# Patient Record
Sex: Female | Born: 1960 | Race: White | Hispanic: No | Smoking: Never smoker
Health system: Southern US, Community
[De-identification: ages and names within clinical notes are randomized; demographics above are authoritative.]

## PROBLEM LIST (undated history)

## (undated) DIAGNOSIS — I2510 Atherosclerotic heart disease of native coronary artery without angina pectoris: Secondary | ICD-10-CM

## (undated) DIAGNOSIS — E785 Hyperlipidemia, unspecified: Secondary | ICD-10-CM

## (undated) DIAGNOSIS — Z8249 Family history of ischemic heart disease and other diseases of the circulatory system: Secondary | ICD-10-CM

## (undated) DIAGNOSIS — I10 Essential (primary) hypertension: Secondary | ICD-10-CM

## (undated) DIAGNOSIS — E079 Disorder of thyroid, unspecified: Secondary | ICD-10-CM

## (undated) HISTORY — DX: Essential (primary) hypertension: I10

## (undated) HISTORY — PX: THYROIDECTOMY: SHX17

## (undated) HISTORY — PX: ABDOMINAL HYSTERECTOMY: SHX81

## (undated) HISTORY — DX: Hyperlipidemia, unspecified: E78.5

## (undated) HISTORY — PX: APPENDECTOMY: SHX54

## (undated) HISTORY — DX: Family history of ischemic heart disease and other diseases of the circulatory system: Z82.49

## (undated) HISTORY — PX: CHOLECYSTECTOMY: SHX55

## (undated) HISTORY — PX: CARDIAC CATHETERIZATION: SHX172

## (undated) HISTORY — DX: Atherosclerotic heart disease of native coronary artery without angina pectoris: I25.10

---

## 1997-12-17 ENCOUNTER — Encounter: Payer: Self-pay | Admitting: Gastroenterology

## 1997-12-17 ENCOUNTER — Ambulatory Visit (HOSPITAL_COMMUNITY): Admission: RE | Admit: 1997-12-17 | Discharge: 1997-12-17 | Payer: Self-pay | Admitting: Gastroenterology

## 1998-10-09 ENCOUNTER — Other Ambulatory Visit: Admission: RE | Admit: 1998-10-09 | Discharge: 1998-10-09 | Payer: Self-pay | Admitting: Obstetrics and Gynecology

## 1999-04-10 ENCOUNTER — Ambulatory Visit (HOSPITAL_COMMUNITY): Admission: RE | Admit: 1999-04-10 | Discharge: 1999-04-10 | Payer: Self-pay | Admitting: Gastroenterology

## 1999-04-10 ENCOUNTER — Encounter: Payer: Self-pay | Admitting: Gastroenterology

## 1999-12-10 ENCOUNTER — Other Ambulatory Visit: Admission: RE | Admit: 1999-12-10 | Discharge: 1999-12-10 | Payer: Self-pay | Admitting: Obstetrics and Gynecology

## 2000-04-12 ENCOUNTER — Encounter: Payer: Self-pay | Admitting: General Surgery

## 2000-04-12 ENCOUNTER — Encounter (INDEPENDENT_AMBULATORY_CARE_PROVIDER_SITE_OTHER): Payer: Self-pay

## 2000-04-12 ENCOUNTER — Observation Stay (HOSPITAL_COMMUNITY): Admission: RE | Admit: 2000-04-12 | Discharge: 2000-04-13 | Payer: Self-pay | Admitting: General Surgery

## 2000-12-05 ENCOUNTER — Encounter: Payer: Self-pay | Admitting: Gastroenterology

## 2000-12-05 ENCOUNTER — Ambulatory Visit (HOSPITAL_COMMUNITY): Admission: RE | Admit: 2000-12-05 | Discharge: 2000-12-05 | Payer: Self-pay | Admitting: Gastroenterology

## 2001-06-26 ENCOUNTER — Other Ambulatory Visit: Admission: RE | Admit: 2001-06-26 | Discharge: 2001-06-26 | Payer: Self-pay | Admitting: Obstetrics and Gynecology

## 2002-12-28 ENCOUNTER — Other Ambulatory Visit: Admission: RE | Admit: 2002-12-28 | Discharge: 2002-12-28 | Payer: Self-pay | Admitting: Obstetrics and Gynecology

## 2004-02-18 ENCOUNTER — Other Ambulatory Visit: Admission: RE | Admit: 2004-02-18 | Discharge: 2004-02-18 | Payer: Self-pay | Admitting: Obstetrics and Gynecology

## 2005-03-09 ENCOUNTER — Other Ambulatory Visit: Admission: RE | Admit: 2005-03-09 | Discharge: 2005-03-09 | Payer: Self-pay | Admitting: Obstetrics and Gynecology

## 2005-08-08 ENCOUNTER — Encounter: Payer: Self-pay | Admitting: Family Medicine

## 2005-08-08 LAB — CONVERTED CEMR LAB: Pap Smear: NORMAL

## 2005-10-04 ENCOUNTER — Ambulatory Visit: Payer: Self-pay | Admitting: Family Medicine

## 2005-11-01 ENCOUNTER — Ambulatory Visit: Payer: Self-pay | Admitting: Family Medicine

## 2006-01-14 ENCOUNTER — Ambulatory Visit: Payer: Self-pay | Admitting: Family Medicine

## 2006-05-25 ENCOUNTER — Encounter: Payer: Self-pay | Admitting: Family Medicine

## 2006-05-25 DIAGNOSIS — K219 Gastro-esophageal reflux disease without esophagitis: Secondary | ICD-10-CM

## 2006-05-25 DIAGNOSIS — E039 Hypothyroidism, unspecified: Secondary | ICD-10-CM | POA: Insufficient documentation

## 2006-07-28 ENCOUNTER — Ambulatory Visit: Payer: Self-pay | Admitting: Family Medicine

## 2006-09-20 ENCOUNTER — Observation Stay (HOSPITAL_COMMUNITY): Admission: EM | Admit: 2006-09-20 | Discharge: 2006-09-20 | Payer: Self-pay | Admitting: Emergency Medicine

## 2006-09-22 ENCOUNTER — Encounter: Payer: Self-pay | Admitting: Family Medicine

## 2009-09-01 ENCOUNTER — Ambulatory Visit (HOSPITAL_BASED_OUTPATIENT_CLINIC_OR_DEPARTMENT_OTHER): Admission: RE | Admit: 2009-09-01 | Discharge: 2009-09-01 | Payer: Self-pay | Admitting: Orthopedic Surgery

## 2010-04-25 LAB — POCT HEMOGLOBIN-HEMACUE: Hemoglobin: 13.5 g/dL (ref 12.0–15.0)

## 2010-06-23 NOTE — H&P (Signed)
[name], [name]               ACCOUNT NO.:  [id number]   MEDICAL RECORD NO.:  [id number]          PATIENT TYPE:  INP   LOCATION:  3742                         FACILITY:  [facility]   PHYSICIAN:  Abelino Derrick, P.A.   DATE OF BIRTH:  01/02/1961   DATE OF ADMISSION:  09/20/2006  DATE OF DISCHARGE:                              HISTORY & PHYSICAL   CHIEF COMPLAINT:  Chest pain.   HISTORY OF PRESENT ILLNESS:  Patient is a 50 year old female with a  history of mitral valve prolapse and palpitations.  She was evaluated 10  years ago by Dr. Clarene Duke.  Echocardiogram was done at that time.  She has  no history of a cath or exercise study.  She is admitted at the  emergency room today with substernal chest pain, described as pressure.  Onset was yesterday off and on, but recurred at rest today at work, with  increasing intensity and some shortness of breath.  She had no  associated nausea or vomiting, diaphoresis or associated palpitations,  or anxiety.  She is now pain-free in the emergency room.   PAST MEDICAL HISTORY:  Remarkable for treated hypothyroidism.  She has  had a cholecystectomy, hysterectomy, tonsillectomy and single  oophorectomy.   CURRENT MEDICATIONS:  1. Synthroid 0.137 mg a day.  2. Pepcid 20-40 mg a day.   ALLERGIES:  She is allergic to SULFA AND MYOSINS.  In the past, she  could not tolerate beta blockers because of fatigue.   SOCIAL HISTORY:  She is divorced.  She has two children in their 36s.  She has never smoked.  She works for an [employer].   FAMILY HISTORY:  Father had a history of coronary disease.  He had PCIs  in his 21s.  He also had an abdominal aortic aneurysm repair and  grafting, and bypass surgery.  He is now a patient of Dr. Darrol Poke.  He  is 10 years old.  Her mother has a history of a pacemaker.   REVIEW OF SYMPTOMS:  Essentially unremarkable except as noted above.  She denies any GI bleeding or melena.  She does not have diabetes.  Her  lipid  status is unknown.  She occasionally has some tingling in her  fingers, but this is not associated with periorbital numbness, anxiety  or shortness of breath.  She denies any significant palpitations  recently.   PHYSICAL EXAMINATION:  VITAL SIGNS:  Blood pressure 115/76, pulse 82,  respirations 12.  GENERAL:  She is a well-developed, well-nourished female in no acute  distress.  She appears calm in the emergency room.  HEENT:  Normocephalic.  Extraocular muscles intact.  Sclera are  nonicteric.  Lens and conjunctiva are within normal limits.  NECK:  Without bruit or JVD.  CHEST:  Clear to auscultation and percussion.  CARDIAC:  Reveals a regular rate and rhythm without murmur, rub or  gallop.  Normal S1 and S2.  ABDOMEN:  Nontender.  No hepatosplenomegaly.  Bowel sounds are present.  EXTREMITIES:  Without edema.  Distal pulses are 3+/4 bilaterally.  NEURO:  Grossly intact.  She is  awake, alert, oriented and cooperative.  She moves all extremities without obvious deficits.  SKIN:  Warm and dry.   LABORATORY DATA:  Sodium 138, potassium 4.0, BUN 12, creatinine 1.0,  white count 6.7, hemoglobin 13.3, hematocrit 38.3, platelets 254.  INR  1.0, troponin negative.  EKG showed sinus rhythm without acute changes.  Chest x-ray shows no active disease.   IMPRESSION:  1. Chest pain, worrisome for unstable angina.  2. Family history of coronary disease.  3. Treated with hypothyroidism.   PLAN:  Patient will be admitted for 23-hour observation, rule out  myocardial infarction.  She may need diagnostic catheterization versus  exercise Myoview.      Abelino Derrick, P.ALenard Lance  D:  09/20/2006  T:  09/21/2006  Job:  [id number]   cc:   Thereasa Solo. Little, M.D.

## 2010-06-23 NOTE — Cardiovascular Report (Signed)
[name], [name]               ACCOUNT NO.:  [id number]   MEDICAL RECORD NO.:  [id number]          PATIENT TYPE:  INP   LOCATION:  3742                         FACILITY:  [facility]   PHYSICIAN:  Ritta Slot, MD     DATE OF BIRTH:  08/10/1960   DATE OF PROCEDURE:  09/20/2006  DATE OF DISCHARGE:  09/20/2006                            CARDIAC CATHETERIZATION   CARDIAC CATHETERIZATION:   PROCEDURES PERFORMED:  1. Selective coronary angiography of the native coronary circulation      by the Judkins technique.  2. Retrograde left heart catheterization.  3. Left ventricular angiography.   COMPLICATIONS:  None.   ENTRY SITE:  Right femoral artery with dye use of Omnipaque.   PATIENT PROFILE:  Patient is a 50 year old Caucasian female who comes in  today complaining of central chest discomfort for the past 24 hours.  There was no radiation associated with nausea, vomiting, diaphoresis.  In the ER, her ECG was negative as well as her troponin.  However, she  does have a significantly strong family history of premature coronary  artery disease with a father having an MI at the age of 59 and  subsequently having a bypass graft at the age of 74 and a mother having  a bypass graft at the age of 13.  Risk factors of coronary artery  disease really are family history, otherwise unremarkable.  She was  brought to the cardiac catheterization for further delineation of  coronary artery disease here at [facility].   RESULTS:  Pressures:  LV pressure was 104-8-8.  Aortic pressure was 104-  64-83.  There was no valve gradient by pullback technique.   ANGIOGRAPHIC RESULTS:  1. The left main coronary artery appears normal.  2. The left anterior descending coronary artery appears normal.  3. The left circumflex system appears normal.  4. The right coronary artery system appears normal; the right coronary      artery is dominant.   FINAL DIAGNOSES:  1. Normal coronary arteries.  2. Normal left  ventriculogram with an ejection fraction of 60%.   PLAN:  1. The patient should be managed with medical therapy.  2. Most likely the chest pain is noncardiac.  3. She needs to follow up with her primary care physician for further      management and investigation.  4. Dr. Lavonne Chick was present and supervised the entire procedure.      Ritta Slot, MD  Electronically Signed     HS/MEDQ  D:  09/20/2006  T:  09/20/2006  Job:  [id number]   cc:   Thereasa Solo. Little, M.D.

## 2010-06-26 NOTE — Assessment & Plan Note (Signed)
[facility]                             [place] OFFICE NOTE   [name], [name]                      MRN:          [id number]  DATE:10/04/2005                            DOB:          1960-12-06    CHIEF COMPLAINT:  A 50 year old white female here to reestablish.  She was  previously seen in 2002.   HISTORY OF PRESENT ILLNESS:  [name] comes to clinic today with two  concerns.  1. Sore throat, acute:  She states that she has been having problems with      sore throat for about one week.  She does have some congestion and      postnasal drip.  She does have a sick contact of her ex-boyfriend.  She      occasionally has a dry cough.  She is somewhat fatigued.  She also is      concerned because she feels that she has some gritty feeling in her      mouth and has some difficulty swallowing because it feels like the back      of her throat is coated.  She has had an oral candidiasis in the past      as well as vaginal candidiasis that she was thought by a previous      doctor to have received from her boyfriend.  She is concerned at this      point that she may have a candidal infection of her throat.  She also      has noticed that she had some irritation and bleeding of her tongue.  2. Stress:  She reports that she has a history of depression.  She is      going through an increased period of stress.  She divorced her husband      two years ago, and it was her choice to leave her husband, but the 25th      anniversary is today.  She states that she is also having an increased      amount of financial stress secondary to being on her own.  She has also      recently broken up several days ago with her last boyfriend.  She is      having some problems with insomnia but not consistently.  She has      occasional episodes of crying and feelings of excessive guilt.  She      denies suicidal or homicidal ideation.  In the past, she has been on      Lexapro, Effexor, and Wellbutrin.  She had significant side effects to      Wellbutrin and did not stay on the other medications long enough to see      if they worked.  She has some leftover Xanax from when she saw a      psychiatrist several years ago.  She took some this morning, a very      small fragment of the pill.  She states that it helped some.   PAST MEDICAL HISTORY:  1. Hypothyroidism, status post  thyroiditis.  2. Gastroesophageal reflux disease.   HOSPITALIZATIONS/SURGERIES/PROCEDURES:  1. In 1990, appendectomy.  2. In 1988, hysterectomy, partial, for menorrhagia.  3. In 2002, cholecystectomy.  4. Tonsillectomy.  5. G4 P2, status post 2 miscarriages.  6. Abdominal ultrasound.  Left ovarian cyst.  7. Mammogram in 2006, normal.  8. Pap smear in July 2007, normal.  9. Colonoscopy in 1990s, normal.   ALLERGIES:  1. CODEINE.  2. SULFA.  3. ERYTHROMYCIN.   MEDICATIONS:  1. Pepcid 20 mg p.o. b.i.d.  2. Synthroid 0.137 mg daily, with last thyroid hormone checked at her      gynecologist.   FAMILY HISTORY:  Unchanged from previously, except for CVA and coronary  artery bypass graft in her father.   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  She is divorced and has 2 children, ages 73 and 48, who are  healthy.  She walks some, but does not get regular aerobic exercise.  She  eats a variety of foods, including fruits and vegetables.   PHYSICAL EXAMINATION:  VITAL SIGNS:  Height 5 feet 3 inches, weight 131,  blood pressure 119/94, pulse 75, temperature 98.3.  GENERAL:  Healthy-appearing female in no apparent distress.  HEENT:  PERRLA.  Extraocular muscles intact.  Nares clear.  Oropharynx  clear.  Gum line:  White striations that wipe away, slight irritation of  tongue noted.  NECK:  No thyromegaly.  No lymphadenopathy, supraclavicular or cervical.  PULMONARY:  Clear to auscultation bilaterally.  No wheezes, rales, or   rhonchi.  CARDIOVASCULAR:  Regular rate and rhythm.  No murmurs, rubs, or gallops.  ABDOMEN:  Soft, nontender.  Normoactive bowel sounds.  No  hepatosplenomegaly.  MUSCULOSKELETAL:  Strength 5/5 in upper and lower extremities.  NEUROLOGIC:  Alert, oriented x3.  Cranial nerves II-XII grossly intact.  Reflexes 2+.  Sensation to touch within normal limits in upper and lower  extremities.  PSYCHIATRIC:  no suicidal or homicidal ideation.  Appropriate affect.  No  psychomotor retardation.   ASSESSMENT AND PLAN:  1. Sore throat.  This may be secondary to a viral infection.  I did do a      swab of her mouth to determine whether there is yeast, but did not see      evidence of any.  Given the severity of her sore throat symptoms and      the fact that it has continued on for a week, I will have her see if it      resolves in the next few days.  She can treat herself symptomatically      with Magic Mouthwash, which she was given a prescription for.  If it      does not improve, she was given a prescription for Diflucan for      possible oropharyngeal candidiasis.  If this seems to be the cause,      then she likely needs lab workup to evaluate for diabetes as well as an      HIV test, if this has not been done recently.  She does state that she      had recent labs done at Dr. Marylen Ponto.  I will obtain these.  2. Depression.  She meets some of the qualifications for depressive      disorder as well as difficulty to adjust to stress.  We discussed      options as far as initiating an  antidepressant, temporary Xanax, and      referral to a therapist.  At this point in time, she would like to      avoid      medication but is not willing to see a therapist.  She will follow up      in 3-4 weeks to discuss how she is doing with her mood.                                   Kerby Nora, MD   AB/MedQ  DD:  10/05/2005  DT:  10/05/2005 Job #:  [id number]

## 2010-06-26 NOTE — Op Note (Signed)
[facility]  Patient:    [name], [name]                      MRN: [id number] Proc. Date: 04/12/00 Adm. Date:  [id number] Attending:  Tempie Donning CC:         Petra Kuba, M.D.   Operative Report  PREOPERATIVE DIAGNOSIS:  Abnormal gallbladder.  POSTOPERATIVE DIAGNOSIS: Abnormal gallbladder, pending pathology.  OPERATION:  Laparoscopic cholecystectomy with intraoperative cholangiogram.  SURGEON:  Gita Kudo, M.D.  ASSISTANT:  Milus Mallick, M.D.  ANESTHESIA:  General endotracheal.  CLINICAL SUMMARY:  A 50 year old female with chronic abdominal pain admitted for elective cholecystectomy.  She has had aggressive workup that has been very complete over the past several years.  Her gallbladder does not have stones.  The biliary scan was also normal, and her liver function studies are normal.  She has had persistent pain that is consistent with cholecystitis, and, after the exhaustive workup including the above plus endoscopy and other tests, she comes in for cholecystectomy based on clinical findings and especially a history of fatty food intolerance.  Her family history is positive with several family members requiring cholecystectomy.  Also, the patient had an attack of what sounds like biliary colic during the CCK study.  OPERATIVE FINDINGS:  The gallbladder was thin walled, and the cystic duct was quite small.  It and the cystic artery are normal in anatomy and location. She had a few adhesions from her previous pelvic surgery.  DESCRIPTION OF PROCEDURE:  Under satisfactory general endotracheal anesthesia with intravenous antibiotic coverage for mitral valve prolapse, the patients abdomen was prepped and draped in a standard fashion.  The previous infraumbilical incision was opened and extended slightly and then the midline opened into the peritoneum.  Controlled with a figure-of-eight 0 Vicryl suture, an operating Hasson  port inserted.  This was secured, and a good CO2 pneumoperitoneum was established.  Camera placed and, under direct vision, three sites infiltrated with Marcaine.  Two #5 ports were placed laterally and a second #10 port medially.  Graspers in the lateral ports gave excellent exposure, and I operated through the medial port.  The right-angle clamp was used to circumferentially dissect both the cystic duct and cystic artery at the gallbladder/cystic duct junction.   When we were certain of the anatomy, the cystic duct was clipped close to the gallbladder and a small incision made.  A cholangiogram catheter passed through an Angiocath and good x-rays obtained.  They showed no abnormality.  Then the catheter was removed and the duct triply clipped distally and divided.  Likewise, the cystic artery was divided between multiple metal clips and then the gallbladder removed from below upward  using the coagulating spatula for hemostasis and dissection.  As the dissection neared the end, small hole was made in the gallbladder, and clear bile came out.  This was suctioned away.  The abdomen was lavaged with saline and then suctioned dry.  The gallbladder was then removed after a clip was applied at the fundus, and it was transected from the liver bed.  An Endocatch bag was placed in the upper port and the gallbladder put in it.  The camera was then moved to the upper port, and through the lower port, a large grasper was used to remove the gallbladder intact and without spillage.  Then the camera was reinserted through the umbilical port and pictures taken of the lobe of the liver to  document its appearance which was basically normal.  Following this, the abdomen was checked for hemostasis, lavaged with saline, suctioned dry, and then CO2 and ports released.  The midline was closed with the previous suture and the second interrupted 0 Vicryl suture.  Subcutaneous tissue was approximated with 4-0  Vicryl and skin edges with Steri-Strips.  Sterile dressings were applied, and the patient went to the recovery room from the operating room in good condition without complications. DD:  04/12/00 TD:  04/12/00 Job: [id number]

## 2010-11-23 LAB — I-STAT 8, (EC8 V) (CONVERTED LAB)
Acid-Base Excess: 3 — ABNORMAL HIGH
Operator id: 234501
Potassium: 4
TCO2: 30
pCO2, Ven: 47.9
pH, Ven: 7.385 — ABNORMAL HIGH

## 2010-11-23 LAB — COMPREHENSIVE METABOLIC PANEL
AST: 18
Albumin: 4
Calcium: 9.1
Creatinine, Ser: 0.79
GFR calc Af Amer: >60

## 2010-11-23 LAB — POCT I-STAT CREATININE
Creatinine, Ser: 1
Operator id: 234501

## 2010-11-23 LAB — CBC
HCT: 38.3
Hemoglobin: 13.3
Platelets: 254
RBC: 3.89
WBC: 6.7

## 2010-11-23 LAB — DIFFERENTIAL
Eosinophils Relative: 1
Lymphocytes Relative: 17
Lymphs Abs: 1.2
Monocytes Relative: 9

## 2010-11-23 LAB — LIPID PANEL
Cholesterol: 204 — ABNORMAL HIGH
HDL: 65
LDL Cholesterol: 122 — ABNORMAL HIGH
Total CHOL/HDL Ratio: 3.1

## 2010-11-23 LAB — CK TOTAL AND CKMB (NOT AT ARMC)
CK, MB: 0.7
Relative Index: INVALID

## 2010-11-23 LAB — PROTIME-INR: Prothrombin Time: 13.1

## 2010-11-23 LAB — POCT CARDIAC MARKERS: Troponin i, poc: <0.05

## 2011-09-29 ENCOUNTER — Encounter (HOSPITAL_COMMUNITY): Payer: Self-pay

## 2011-09-29 ENCOUNTER — Emergency Department (HOSPITAL_COMMUNITY)
Admission: EM | Admit: 2011-09-29 | Discharge: 2011-09-29 | Disposition: A | Discharge status: Home / Self Care | Payer: BC Managed Care – PPO | Source: Home / Self Care | Attending: Emergency Medicine | Admitting: Emergency Medicine

## 2011-09-29 DIAGNOSIS — S80869A Insect bite (nonvenomous), unspecified lower leg, initial encounter: Secondary | ICD-10-CM

## 2011-09-29 DIAGNOSIS — S90569A Insect bite (nonvenomous), unspecified ankle, initial encounter: Secondary | ICD-10-CM

## 2011-09-29 DIAGNOSIS — T148XXA Other injury of unspecified body region, initial encounter: Secondary | ICD-10-CM

## 2011-09-29 HISTORY — DX: Disorder of thyroid, unspecified: E07.9

## 2011-09-29 MED ORDER — DOXYCYCLINE HYCLATE 100 MG PO CAPS: 100.0000 mg | ORAL_CAPSULE | Freq: Two times a day (BID) | ORAL | Status: AC

## 2011-09-29 MED ORDER — TRIAMCINOLONE ACETONIDE 0.1 % EX CREA: TOPICAL_CREAM | Freq: Two times a day (BID) | CUTANEOUS | Status: DC

## 2011-09-29 NOTE — ED Provider Notes (Addendum)
History     CSN: [id number]  Arrival date & time 09/29/11  1145   First MD Initiated Contact with Patient 09/29/11 1216      Chief Complaint  Patient presents with  . Tick Removal    (Consider location/radiation/quality/duration/timing/severity/associated sxs/prior treatment) HPI Comments: Patient presents urgent care this morning complaining that she removed a tick from the posterior aspect of her knee yesterday and there is a whole area of redness around the area where the tick was. 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 Patient describes that she removed the tick and it was "a very tiny one that was not engorged with blood". She fears that she might have left part of it inside and  under her skin. Describes discrete itchiness to the area. She is mainly concerned that she could have been exposed to Lyme's disease. (Patient has not traveled outside of the state and have not been camping recently in any other state. Patient at this point denies any headaches, fevers, myalgias, arthralgias, nausea or abdominal pain.  The history is provided by the patient.    Past Medical History  Diagnosis Date  . Thyroid disease     Past Surgical History  Procedure Date  . Abdominal hysterectomy   . Cholecystectomy   . Appendectomy   . Thyroidectomy     History reviewed. No pertinent family history.  History  Substance Use Topics  . Smoking status: Never Smoker   . Smokeless tobacco: Not on file  . Alcohol Use: Yes    OB History    Grav Para Term Preterm Abortions TAB SAB Ect Mult Living                  Review of Systems  Constitutional: Negative for fever, chills, activity change, appetite change and fatigue.  Musculoskeletal: Negative for arthralgias.  Skin: Positive for color change and rash. Negative for wound.  Neurological: Negative for dizziness and headaches.    Allergies  Review of patient's allergies indicates not on  file.  Home Medications   Current Outpatient Rx  Name Route Sig Dispense Refill  . FAMOTIDINE 20 MG PO TABS Oral Take 20 mg by mouth 2 (two) times daily.    . THYROID 120 MG PO TABS Oral Take 120 mg by mouth daily.    Marland Kitchen DOXYCYCLINE HYCLATE 100 MG PO CAPS Oral Take 1 capsule (100 mg total) by mouth 2 (two) times daily. 20 capsule 0  . TRIAMCINOLONE ACETONIDE 0.1 % EX CREA Topical Apply topically 2 (two) times daily. Apply bid x 7 days 30 g 0    BP 129/87  Pulse 68  Temp 98 F (36.7 C) (Oral)  Resp 19  SpO2 97%  Physical Exam  Nursing note and vitals reviewed. Constitutional: She appears well-developed and well-nourished. No distress.  Neurological: She is alert.  Skin: No bruising and no rash noted. Rash is not nodular and not vesicular. There is erythema.       ED Course  Procedures (including critical care time)  Labs Reviewed - No data to display No results found.   1. Tick bite of lower leg       MDM   Fibrotic area with minimal erythema, no warmth or tenderness. In the posterior popliteal fossa of right leg. Patient has multiple concerns it range from having been exposed to lyme's disease, plus the potential of a residual piece of the tick mouth and dated in her skin. We  discuss in detail symptoms and signs of a Rickettsia related infection. She denies any other symptoms that we discussed a we discuss in detail the incubation period. We'll still discuss the not so imperative need to remove any residual material that could have been left in her subcutaneous tissue from the tick mouth apparatus. She somewhat agreed with not poking or exploring it. Have provided her with a doxycycline prescription and transabdominal brain she's describing some itchiness in minimal discomfort. She will not start using doxycycline and will be monitoring the symptoms specifically asked to start taking the antibiotic if necessary. Patient has some type of localize hypersensitivity reaction and  minimal erythema around the area. Patient describes that when she removed the tick it had no blood in it and it was very small       Jimmie Molly, MD 09/29/11 2154  Jimmie Molly, MD 09/29/11 2155

## 2011-09-29 NOTE — ED Notes (Signed)
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

## 2011-12-01 ENCOUNTER — Other Ambulatory Visit: Payer: Self-pay | Admitting: Cardiovascular Disease

## 2011-12-03 ENCOUNTER — Ambulatory Visit (HOSPITAL_COMMUNITY)
Admission: RE | Admit: 2011-12-03 | Discharge: 2011-12-03 | Disposition: A | Discharge status: Home / Self Care | Payer: BC Managed Care – PPO | Source: Ambulatory Visit | Attending: Cardiovascular Disease | Admitting: Cardiovascular Disease

## 2011-12-03 ENCOUNTER — Encounter (HOSPITAL_COMMUNITY)
Admission: RE | Disposition: A | Discharge status: Home / Self Care | Payer: Self-pay | Source: Ambulatory Visit | Attending: Cardiovascular Disease

## 2011-12-03 DIAGNOSIS — R9439 Abnormal result of other cardiovascular function study: Secondary | ICD-10-CM | POA: Insufficient documentation

## 2011-12-03 DIAGNOSIS — R55 Syncope and collapse: Secondary | ICD-10-CM | POA: Insufficient documentation

## 2011-12-03 HISTORY — PX: LEFT HEART CATHETERIZATION WITH CORONARY ANGIOGRAM: SHX5451

## 2011-12-03 LAB — PROTIME-INR
INR: 0.88 (ref 0.00–1.49)
Prothrombin Time: 11.9 seconds (ref 11.6–15.2)

## 2011-12-03 SURGERY — LEFT HEART CATHETERIZATION WITH CORONARY ANGIOGRAM
Anesthesia: LOCAL

## 2011-12-03 MED ORDER — SODIUM CHLORIDE 0.9 % IV SOLN
INTRAVENOUS | Status: DC
  Administered 2011-12-03: 09:00:00 via INTRAVENOUS

## 2011-12-03 MED ORDER — NITROGLYCERIN 0.2 MG/ML ON CALL CATH LAB
INTRAVENOUS | Status: AC
  Filled 2011-12-03: qty 1

## 2011-12-03 MED ORDER — ONDANSETRON HCL 4 MG/2ML IJ SOLN: 4.0000 mg | Freq: Four times a day (QID) | INTRAMUSCULAR | Status: DC | PRN

## 2011-12-03 MED ORDER — LIDOCAINE HCL (PF) 1 % IJ SOLN
INTRAMUSCULAR | Status: AC
  Filled 2011-12-03: qty 30

## 2011-12-03 MED ORDER — OXYCODONE-ACETAMINOPHEN 5-325 MG PO TABS: 1.0000 | ORAL_TABLET | ORAL | Status: DC | PRN

## 2011-12-03 MED ORDER — FENTANYL CITRATE 0.05 MG/ML IJ SOLN
INTRAMUSCULAR | Status: AC
  Filled 2011-12-03: qty 2

## 2011-12-03 MED ORDER — MIDAZOLAM HCL 2 MG/2ML IJ SOLN
INTRAMUSCULAR | Status: AC
  Filled 2011-12-03: qty 2

## 2011-12-03 MED ORDER — SODIUM CHLORIDE 0.9 % IJ SOLN: 3.0000 mL | Freq: Two times a day (BID) | INTRAMUSCULAR | Status: DC

## 2011-12-03 MED ORDER — SODIUM CHLORIDE 0.9 % IV SOLN: 1.0000 mL/kg/h | INTRAVENOUS | Status: DC

## 2011-12-03 MED ORDER — HEPARIN SODIUM (PORCINE) 1000 UNIT/ML IJ SOLN
INTRAMUSCULAR | Status: AC
  Filled 2011-12-03: qty 1

## 2011-12-03 MED ORDER — ATROPINE SULFATE 1 MG/ML IJ SOLN
INTRAMUSCULAR | Status: AC
  Filled 2011-12-03: qty 1

## 2011-12-03 MED ORDER — ACETAMINOPHEN 325 MG PO TABS: 650.0000 mg | ORAL_TABLET | ORAL | Status: DC | PRN

## 2011-12-03 NOTE — Progress Notes (Signed)
I was called to patients room because of decreased right-sided peripheral vision in the right eye.  When I arrived the pt stated her vision was back too normal.  Cranial nerves appear intact.  Left arm strength 4/5.  I did not check the right do to just having a radial cath.  BP109/68.  Heart:  RRR, no MM, R, G.    Will ambulate in the hall prior to DC.  I instructed the patient to go to the ER should her vision change.  [name] 1:36 PM.

## 2011-12-03 NOTE — CV Procedure (Signed)
CARDIAC CATHETERIZATION REPORT   Procedures performed:  1. Left heart catheterization  2. Selective coronary angiography  3. Left ventriculography   Reason for procedure:  Abnormal stress test   Procedure performed by: Thurmon Fair, MD, [facility]  Complications: none   Estimated blood loss: less than 5 mL   History:  This is a 51 year old woman with hyperlipidemia and a family history of premature cardiac illness with recurrent problems with chest discomfort shortness of breath and an abnormal cardiopulmonary stress test suggestive of ischemic response.  Consent: The risks, benefits, and details of the procedure were explained to the patient. Risks including death, MI, stroke, bleeding, limb ischemia, renal failure and allergy were described and accepted by the patient. Informed written consent was obtained prior to proceeding.  Technique: The patient was brought to the cardiac catheterization laboratory in the fasting state. He was prepped and draped in the usual sterile fashion. Local anesthesia with 1% lidocaine was administered to the right wrist area. Using the modified Seldinger technique a 5 French right radial artery sheath was introduced without difficulty. Under fluoroscopic guidance, using 5 [place] JL3.5, JR and angled pigtail catheters, selective cannulation of the left coronary artery, right coronary artery and left ventricle were respectively performed. Several coronary angiograms in a variety of projections were recorded, as well as a left ventriculogram in the RAO projection. Left ventricular pressure and a pull back to the aorta were recorded. During the initial passage of the catheter through the innominate artery and aortic arch the patient developed a brief vasovagal response with mild bradycardia hypotension and nausea. This responded promptly to administration of intravenous fluids and atropine. No immediate complications occurred. At the end of the procedure, all catheters  were removed. After the procedure, hemostasis will be achieved with manual pressure.  Contrast used: 50 mL Omnipaque  Angiographic Findings:  1. The left main coronary artery is free of significant atherosclerosis and trifurcates into the left anterior descending artery, a very large ramus intermedius artery and the left circumflex coronary artery.  2. The left anterior descending artery is a large vessel that reaches the apex and generates single major diagonal branch. There is evidence of no luminal irregularities and no calcification. no hemodynamically meaningful stenoses are seen. 3. The left circumflex coronary artery is a small-size vessel non- dominant vessel that generates a single major oblique marginal artery.  There is evidence of nno luminal irregularities and no calcification. no hemodynamically meaningful stenoses are seen. 4. The right coronary artery is a large-size dominant vessel that generates a very long posterior lateral ventricular system as well as the PDA. There is evidence of no luminal irregularities and no calcification. no hemodynamically meaningful stenoses are seen.  5. The left ventricle is normal in size. The left ventricle systolic function is normal with an estimated ejection fraction of 55-60%. Regional wall motion abnormalities are not seen. No left ventricular thrombus is seen. There is no mitral insufficiency. The ascending aorta appears normal. There is no aortic valve stenosis by pullback. The left ventricular end-diastolic pressure is 1 mm Hg.    IMPRESSIONS:   Normal coronary arteries, no evidence of structural cardiac disease. Vasovagal presyncope.  RECOMMENDATION:  Treatment of coronary risk factors. Behavioral changes to avoid vasovagal syncope. Valid for noncardiac causes of chest pain

## 2011-12-03 NOTE — H&P (Signed)
Date of Initial H&P: 11/24/2011  History reviewed, patient examined, no change in status, stable for surgery. Abnormal cardiopulmonary stress test in a patient with intermediate risk factor profile and atypical symptoms for CAD. This procedure has been fully reviewed with the patient and written informed consent has been obtained.  Thurmon Fair, MD, [facility] [phone] office [phone] pager 12/03/2011 8:17 AM

## 2012-07-27 ENCOUNTER — Other Ambulatory Visit: Payer: Self-pay | Admitting: Endocrinology

## 2012-07-27 ENCOUNTER — Ambulatory Visit
Admission: RE | Admit: 2012-07-27 | Discharge: 2012-07-27 | Disposition: A | Discharge status: Home / Self Care | Payer: BC Managed Care – PPO | Source: Ambulatory Visit | Attending: Endocrinology | Admitting: Endocrinology

## 2012-07-27 DIAGNOSIS — M79661 Pain in right lower leg: Secondary | ICD-10-CM

## 2013-12-21 ENCOUNTER — Other Ambulatory Visit: Payer: Self-pay | Admitting: Gastroenterology

## 2013-12-21 DIAGNOSIS — R1013 Epigastric pain: Secondary | ICD-10-CM

## 2013-12-27 ENCOUNTER — Ambulatory Visit
Admission: RE | Admit: 2013-12-27 | Discharge: 2013-12-27 | Disposition: A | Discharge status: Home / Self Care | Payer: BC Managed Care – PPO | Source: Ambulatory Visit | Attending: Gastroenterology | Admitting: Gastroenterology

## 2013-12-27 DIAGNOSIS — R1013 Epigastric pain: Secondary | ICD-10-CM

## 2013-12-27 MED ORDER — IOHEXOL 300 MG/ML  SOLN
100.0000 mL | Freq: Once | INTRAMUSCULAR | Status: AC | PRN
  Administered 2013-12-27: 100 mL via INTRAVENOUS

## 2014-01-17 ENCOUNTER — Encounter (HOSPITAL_COMMUNITY): Payer: Self-pay | Admitting: Cardiovascular Disease

## 2015-04-29 IMAGING — US US EXTREM LOW VENOUS*R*
1 series · 14 of 24 positions shown · non-contrast
Comparison: None

CLINICAL DATA: rt calf pain/r/o dvt;;

RIGHT LOWER EXTREMITY VENOUS DUPLEX ULTRASOUND
TECHNIQUE: Gray-scale sonography with compression, as well as color
and duplex ultrasound, were performed to evaluate the deep venous
system from the level of the common femoral vein through the
popliteal and proximal calf veins.

[Series 1: us extrem low venous*right* · 14 of 35 slices shown]
[im 1/35]
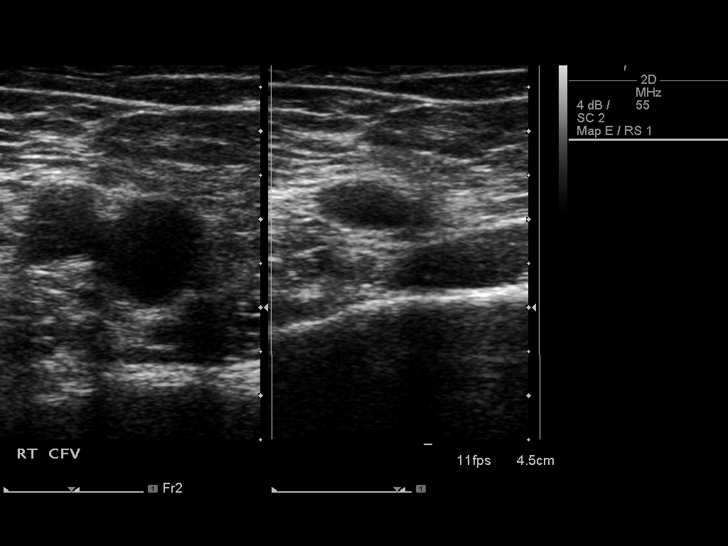
[im 3/35]
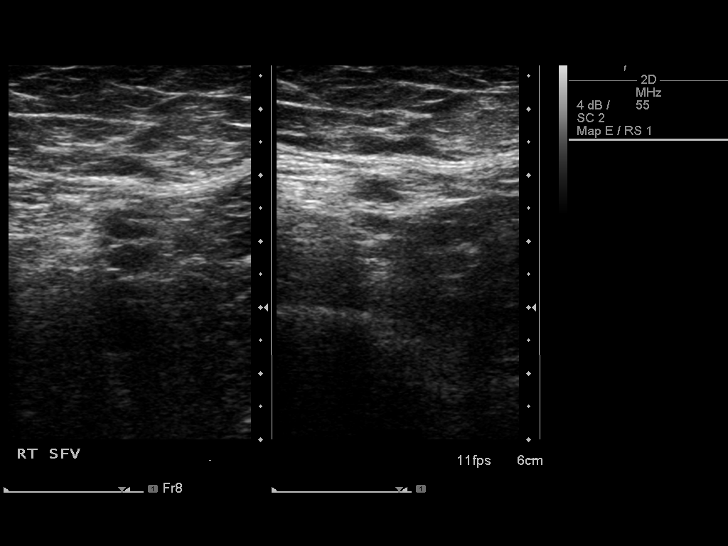
[im 6/35]
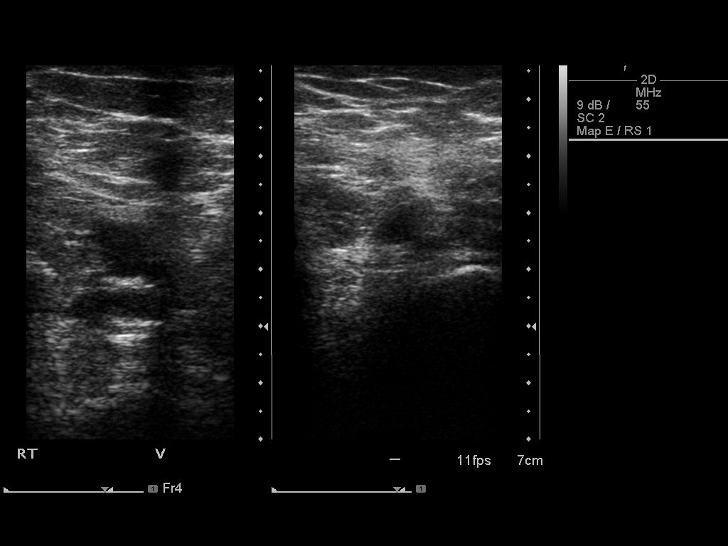
[im 9/35]
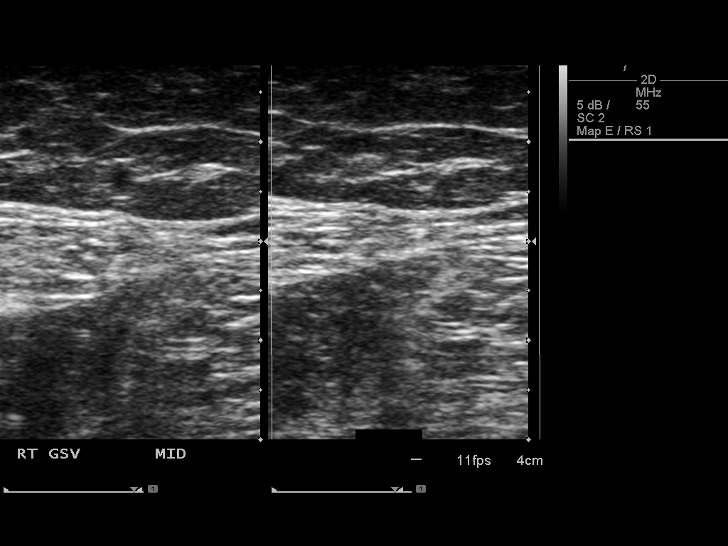
[im 11/35]
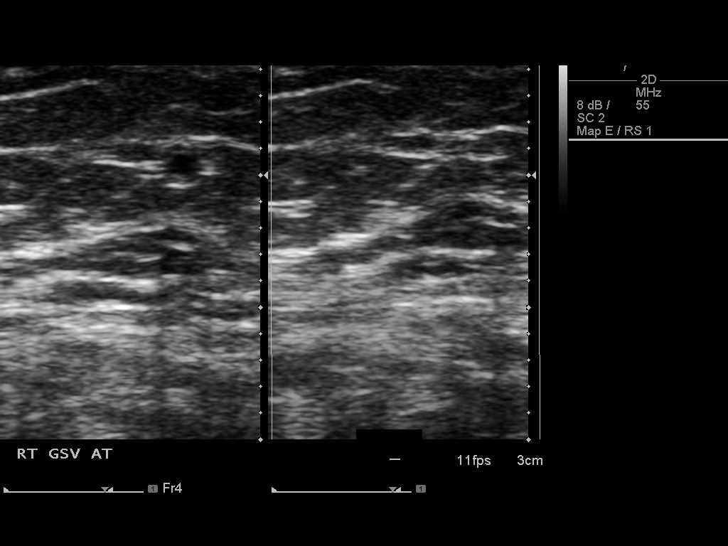
[im 14/35]
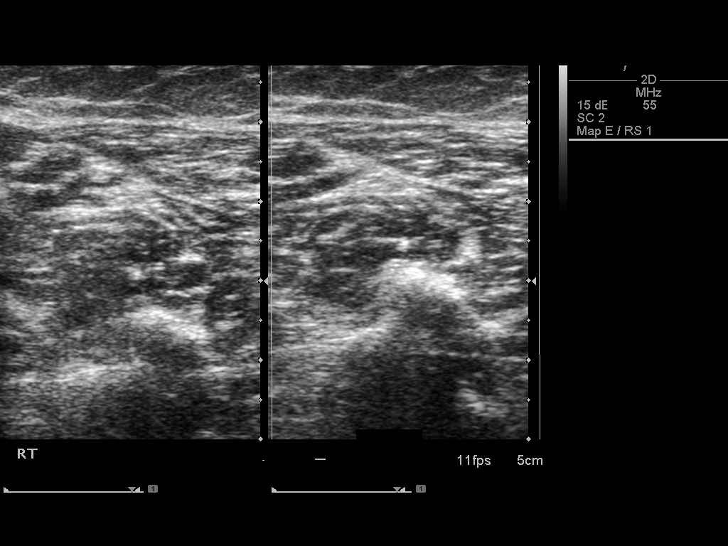
[im 17/35]
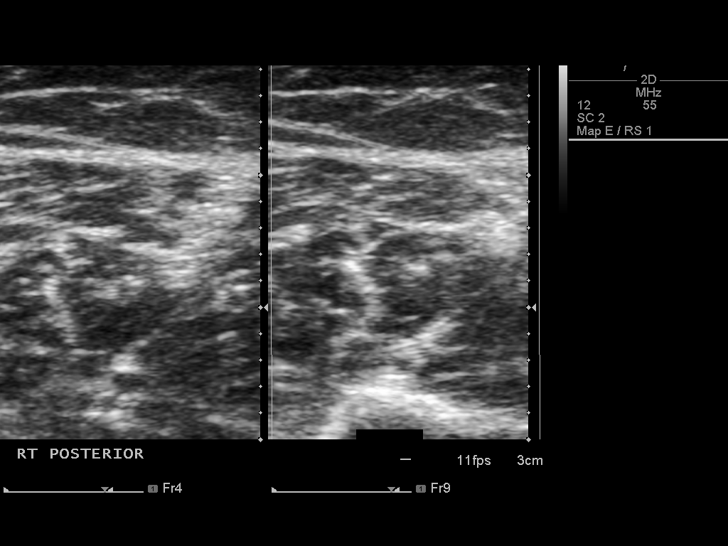
[im 18/35]
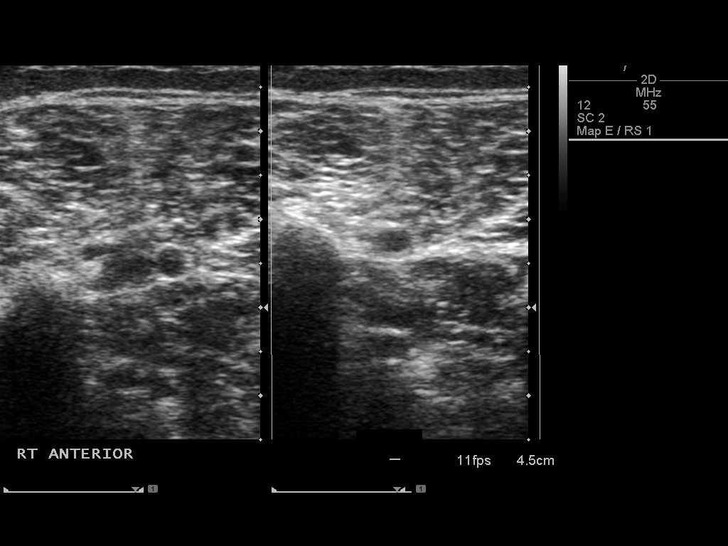
[im 21/35]
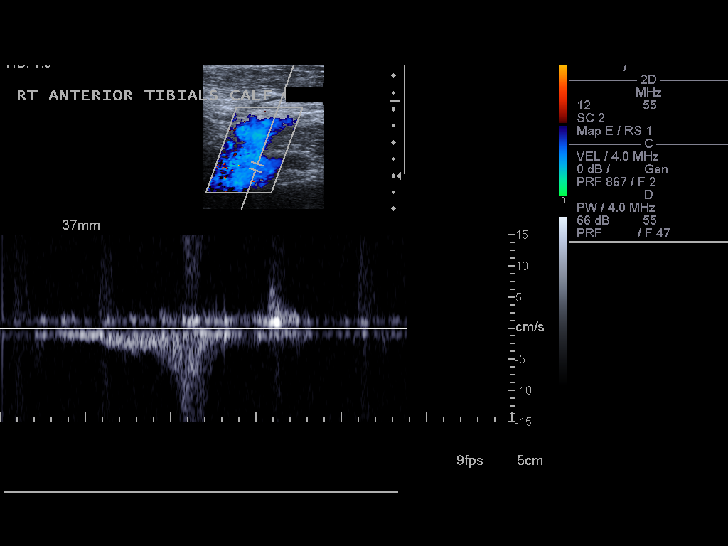
[im 24/35]
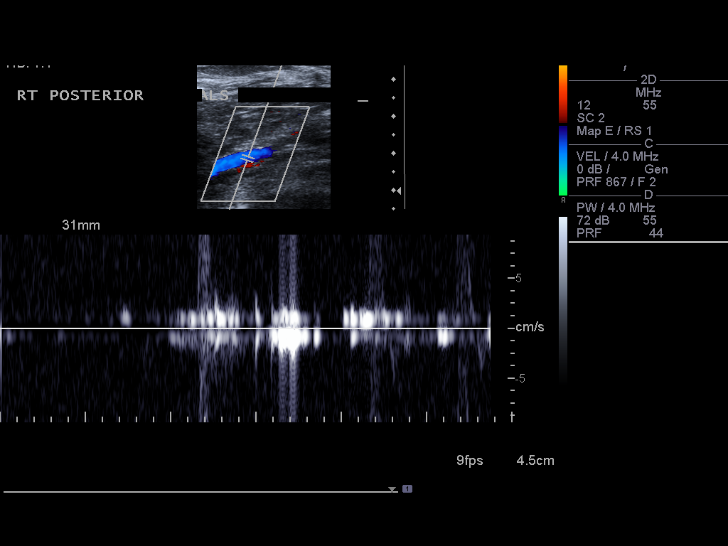
[im 27/35]
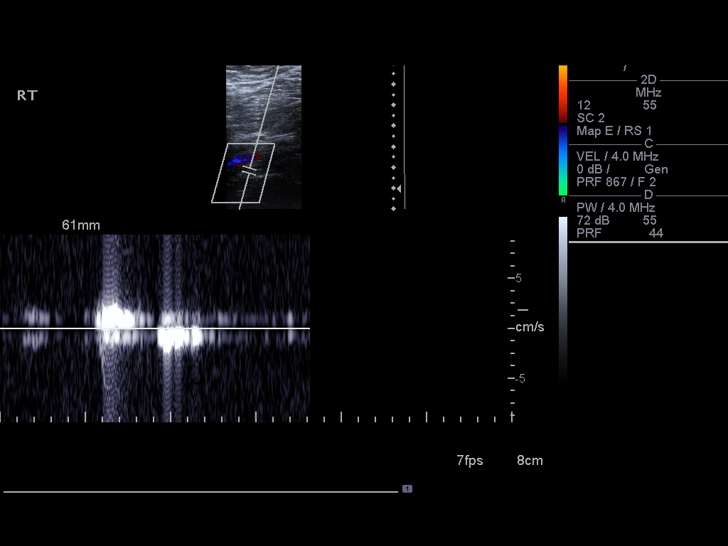
[im 29/35]
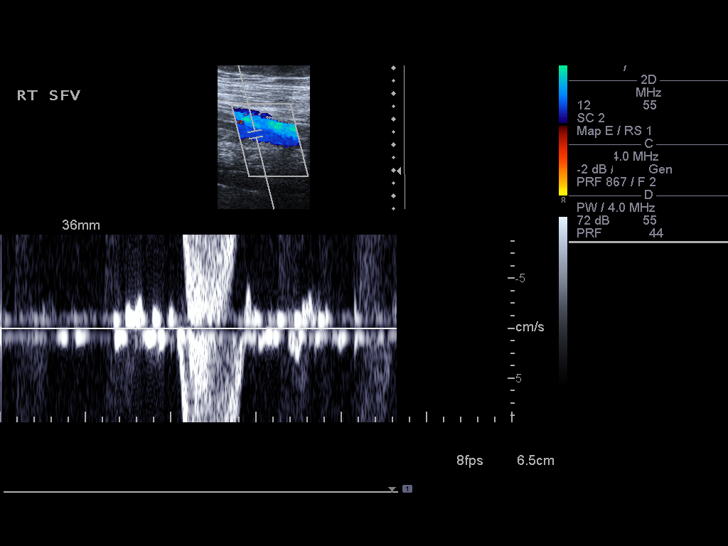
[im 32/35]
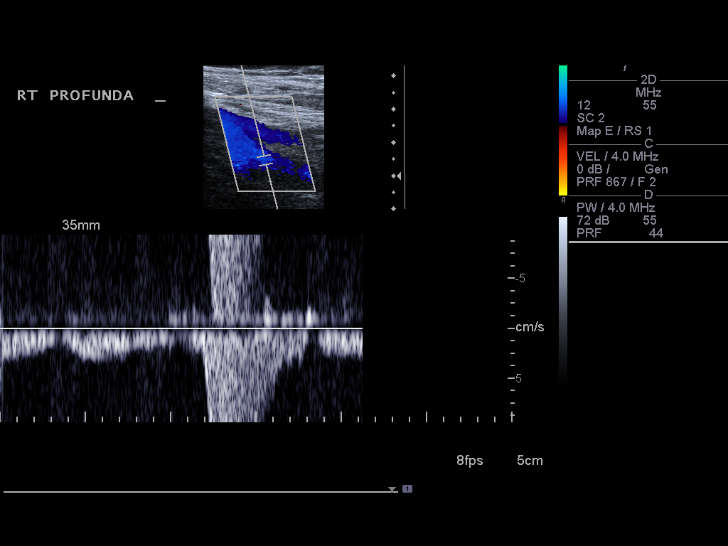
[im 35/35]
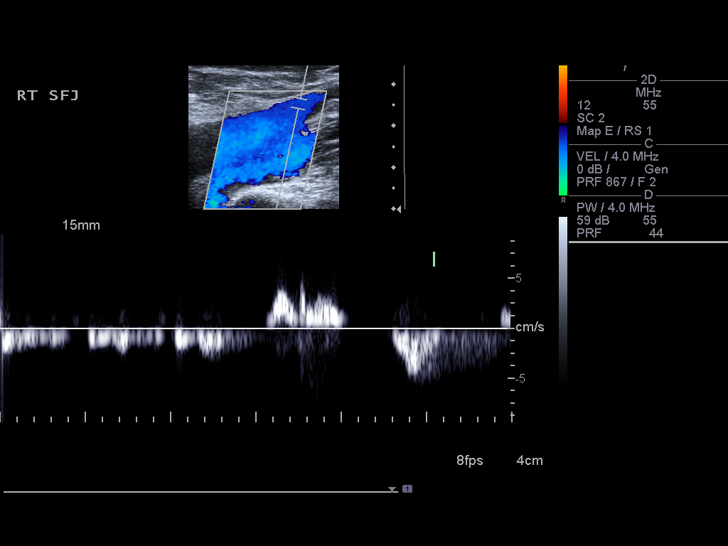

[14 of 24 positions shown; findings below may reference images not displayed]

FINDINGS: Normal compressibility and normal Doppler signal within
the common femoral, superficial femoral and popliteal veins, down
to the proximal calf veins.  No grayscale filling defects to
suggest DVT.
IMPRESSION: No evidence of right lower extremity deep vein thrombosis.

## 2015-06-12 DIAGNOSIS — K219 Gastro-esophageal reflux disease without esophagitis: Secondary | ICD-10-CM | POA: Diagnosis not present

## 2015-06-12 DIAGNOSIS — H6590 Unspecified nonsuppurative otitis media, unspecified ear: Secondary | ICD-10-CM | POA: Diagnosis not present

## 2015-06-19 DIAGNOSIS — H5203 Hypermetropia, bilateral: Secondary | ICD-10-CM | POA: Diagnosis not present

## 2015-06-19 DIAGNOSIS — H53002 Unspecified amblyopia, left eye: Secondary | ICD-10-CM | POA: Diagnosis not present

## 2015-07-17 DIAGNOSIS — E0789 Other specified disorders of thyroid: Secondary | ICD-10-CM | POA: Diagnosis not present

## 2015-07-24 DIAGNOSIS — E032 Hypothyroidism due to medicaments and other exogenous substances: Secondary | ICD-10-CM | POA: Diagnosis not present

## 2015-09-25 DIAGNOSIS — R140 Abdominal distension (gaseous): Secondary | ICD-10-CM | POA: Diagnosis not present

## 2015-09-25 DIAGNOSIS — K219 Gastro-esophageal reflux disease without esophagitis: Secondary | ICD-10-CM | POA: Diagnosis not present

## 2015-09-25 DIAGNOSIS — K5900 Constipation, unspecified: Secondary | ICD-10-CM | POA: Diagnosis not present

## 2015-09-25 DIAGNOSIS — Z1211 Encounter for screening for malignant neoplasm of colon: Secondary | ICD-10-CM | POA: Diagnosis not present

## 2015-10-20 DIAGNOSIS — K219 Gastro-esophageal reflux disease without esophagitis: Secondary | ICD-10-CM | POA: Diagnosis not present

## 2015-10-20 DIAGNOSIS — E0789 Other specified disorders of thyroid: Secondary | ICD-10-CM | POA: Diagnosis not present

## 2015-10-30 DIAGNOSIS — E032 Hypothyroidism due to medicaments and other exogenous substances: Secondary | ICD-10-CM | POA: Diagnosis not present

## 2015-11-03 DIAGNOSIS — K219 Gastro-esophageal reflux disease without esophagitis: Secondary | ICD-10-CM | POA: Diagnosis not present

## 2015-11-03 DIAGNOSIS — K621 Rectal polyp: Secondary | ICD-10-CM | POA: Diagnosis not present

## 2015-11-03 DIAGNOSIS — K635 Polyp of colon: Secondary | ICD-10-CM | POA: Diagnosis not present

## 2015-11-03 DIAGNOSIS — D125 Benign neoplasm of sigmoid colon: Secondary | ICD-10-CM | POA: Diagnosis not present

## 2015-11-03 DIAGNOSIS — Z1211 Encounter for screening for malignant neoplasm of colon: Secondary | ICD-10-CM | POA: Diagnosis not present

## 2015-12-02 DIAGNOSIS — Z01419 Encounter for gynecological examination (general) (routine) without abnormal findings: Secondary | ICD-10-CM | POA: Diagnosis not present

## 2015-12-02 DIAGNOSIS — Z6828 Body mass index (BMI) 28.0-28.9, adult: Secondary | ICD-10-CM | POA: Diagnosis not present

## 2015-12-24 DIAGNOSIS — E0789 Other specified disorders of thyroid: Secondary | ICD-10-CM | POA: Diagnosis not present

## 2016-01-07 DIAGNOSIS — E0789 Other specified disorders of thyroid: Secondary | ICD-10-CM | POA: Diagnosis not present

## 2016-01-07 DIAGNOSIS — E789 Disorder of lipoprotein metabolism, unspecified: Secondary | ICD-10-CM | POA: Diagnosis not present

## 2016-03-03 DIAGNOSIS — E782 Mixed hyperlipidemia: Secondary | ICD-10-CM | POA: Diagnosis not present

## 2016-03-03 DIAGNOSIS — E0789 Other specified disorders of thyroid: Secondary | ICD-10-CM | POA: Diagnosis not present

## 2016-03-10 DIAGNOSIS — E032 Hypothyroidism due to medicaments and other exogenous substances: Secondary | ICD-10-CM | POA: Diagnosis not present

## 2016-03-10 DIAGNOSIS — R079 Chest pain, unspecified: Secondary | ICD-10-CM | POA: Diagnosis not present

## 2016-03-10 DIAGNOSIS — E789 Disorder of lipoprotein metabolism, unspecified: Secondary | ICD-10-CM | POA: Diagnosis not present

## 2016-04-28 DIAGNOSIS — I10 Essential (primary) hypertension: Secondary | ICD-10-CM | POA: Diagnosis not present

## 2016-04-28 DIAGNOSIS — E0789 Other specified disorders of thyroid: Secondary | ICD-10-CM | POA: Diagnosis not present

## 2016-07-27 DIAGNOSIS — E0789 Other specified disorders of thyroid: Secondary | ICD-10-CM | POA: Diagnosis not present

## 2016-07-28 DIAGNOSIS — S20219A Contusion of unspecified front wall of thorax, initial encounter: Secondary | ICD-10-CM | POA: Diagnosis not present

## 2016-08-03 DIAGNOSIS — R079 Chest pain, unspecified: Secondary | ICD-10-CM | POA: Diagnosis not present

## 2016-08-09 DIAGNOSIS — R918 Other nonspecific abnormal finding of lung field: Secondary | ICD-10-CM | POA: Diagnosis not present

## 2016-08-09 DIAGNOSIS — S20219A Contusion of unspecified front wall of thorax, initial encounter: Secondary | ICD-10-CM | POA: Diagnosis not present

## 2016-08-09 DIAGNOSIS — R079 Chest pain, unspecified: Secondary | ICD-10-CM | POA: Diagnosis not present

## 2016-10-21 DIAGNOSIS — D2311 Other benign neoplasm of skin of right eyelid, including canthus: Secondary | ICD-10-CM | POA: Diagnosis not present

## 2016-10-21 DIAGNOSIS — H53002 Unspecified amblyopia, left eye: Secondary | ICD-10-CM | POA: Diagnosis not present

## 2016-10-21 DIAGNOSIS — H5203 Hypermetropia, bilateral: Secondary | ICD-10-CM | POA: Diagnosis not present

## 2016-10-21 DIAGNOSIS — D2312 Other benign neoplasm of skin of left eyelid, including canthus: Secondary | ICD-10-CM | POA: Diagnosis not present

## 2016-11-04 DIAGNOSIS — R1310 Dysphagia, unspecified: Secondary | ICD-10-CM | POA: Diagnosis not present

## 2016-11-04 DIAGNOSIS — J029 Acute pharyngitis, unspecified: Secondary | ICD-10-CM | POA: Diagnosis not present

## 2016-11-04 DIAGNOSIS — Z8639 Personal history of other endocrine, nutritional and metabolic disease: Secondary | ICD-10-CM | POA: Diagnosis not present

## 2016-11-04 DIAGNOSIS — R070 Pain in throat: Secondary | ICD-10-CM | POA: Diagnosis not present

## 2016-11-04 DIAGNOSIS — K219 Gastro-esophageal reflux disease without esophagitis: Secondary | ICD-10-CM | POA: Diagnosis not present

## 2017-01-06 DIAGNOSIS — B079 Viral wart, unspecified: Secondary | ICD-10-CM | POA: Diagnosis not present

## 2017-01-06 DIAGNOSIS — L720 Epidermal cyst: Secondary | ICD-10-CM | POA: Diagnosis not present

## 2017-01-06 DIAGNOSIS — D485 Neoplasm of uncertain behavior of skin: Secondary | ICD-10-CM | POA: Diagnosis not present

## 2017-01-06 DIAGNOSIS — B078 Other viral warts: Secondary | ICD-10-CM | POA: Diagnosis not present

## 2017-01-06 DIAGNOSIS — L570 Actinic keratosis: Secondary | ICD-10-CM | POA: Diagnosis not present

## 2017-01-06 DIAGNOSIS — I781 Nevus, non-neoplastic: Secondary | ICD-10-CM | POA: Diagnosis not present

## 2017-01-10 DIAGNOSIS — E039 Hypothyroidism, unspecified: Secondary | ICD-10-CM | POA: Diagnosis not present

## 2017-01-10 DIAGNOSIS — K219 Gastro-esophageal reflux disease without esophagitis: Secondary | ICD-10-CM | POA: Diagnosis not present

## 2017-01-13 DIAGNOSIS — E039 Hypothyroidism, unspecified: Secondary | ICD-10-CM | POA: Diagnosis not present

## 2017-01-20 DIAGNOSIS — E039 Hypothyroidism, unspecified: Secondary | ICD-10-CM | POA: Diagnosis not present

## 2017-01-20 DIAGNOSIS — E782 Mixed hyperlipidemia: Secondary | ICD-10-CM | POA: Diagnosis not present

## 2017-01-20 DIAGNOSIS — Z9889 Other specified postprocedural states: Secondary | ICD-10-CM | POA: Diagnosis not present

## 2017-03-02 DIAGNOSIS — Z1231 Encounter for screening mammogram for malignant neoplasm of breast: Secondary | ICD-10-CM | POA: Diagnosis not present

## 2017-03-29 DIAGNOSIS — E039 Hypothyroidism, unspecified: Secondary | ICD-10-CM | POA: Diagnosis not present

## 2017-04-05 DIAGNOSIS — N951 Menopausal and female climacteric states: Secondary | ICD-10-CM | POA: Diagnosis not present

## 2017-04-05 DIAGNOSIS — E890 Postprocedural hypothyroidism: Secondary | ICD-10-CM | POA: Diagnosis not present

## 2017-04-05 DIAGNOSIS — E039 Hypothyroidism, unspecified: Secondary | ICD-10-CM | POA: Diagnosis not present

## 2017-04-05 DIAGNOSIS — N958 Other specified menopausal and perimenopausal disorders: Secondary | ICD-10-CM | POA: Diagnosis not present

## 2017-04-06 DIAGNOSIS — R232 Flushing: Secondary | ICD-10-CM | POA: Diagnosis not present

## 2017-04-06 DIAGNOSIS — R5383 Other fatigue: Secondary | ICD-10-CM | POA: Diagnosis not present

## 2017-04-06 DIAGNOSIS — R6882 Decreased libido: Secondary | ICD-10-CM | POA: Diagnosis not present

## 2017-04-06 DIAGNOSIS — N951 Menopausal and female climacteric states: Secondary | ICD-10-CM | POA: Diagnosis not present

## 2017-05-03 DIAGNOSIS — R5383 Other fatigue: Secondary | ICD-10-CM | POA: Diagnosis not present

## 2017-05-03 DIAGNOSIS — R232 Flushing: Secondary | ICD-10-CM | POA: Diagnosis not present

## 2017-05-03 DIAGNOSIS — R6882 Decreased libido: Secondary | ICD-10-CM | POA: Diagnosis not present

## 2017-05-03 DIAGNOSIS — N951 Menopausal and female climacteric states: Secondary | ICD-10-CM | POA: Diagnosis not present

## 2017-05-06 DIAGNOSIS — R6882 Decreased libido: Secondary | ICD-10-CM | POA: Diagnosis not present

## 2017-05-06 DIAGNOSIS — R5383 Other fatigue: Secondary | ICD-10-CM | POA: Diagnosis not present

## 2017-05-06 DIAGNOSIS — R232 Flushing: Secondary | ICD-10-CM | POA: Diagnosis not present

## 2017-05-06 DIAGNOSIS — N951 Menopausal and female climacteric states: Secondary | ICD-10-CM | POA: Diagnosis not present

## 2017-06-01 DIAGNOSIS — E039 Hypothyroidism, unspecified: Secondary | ICD-10-CM | POA: Diagnosis not present

## 2017-06-07 DIAGNOSIS — E782 Mixed hyperlipidemia: Secondary | ICD-10-CM | POA: Diagnosis not present

## 2017-06-07 DIAGNOSIS — E039 Hypothyroidism, unspecified: Secondary | ICD-10-CM | POA: Diagnosis not present

## 2017-06-07 DIAGNOSIS — Z9889 Other specified postprocedural states: Secondary | ICD-10-CM | POA: Diagnosis not present

## 2017-09-01 DIAGNOSIS — M9905 Segmental and somatic dysfunction of pelvic region: Secondary | ICD-10-CM | POA: Diagnosis not present

## 2017-09-01 DIAGNOSIS — M41126 Adolescent idiopathic scoliosis, lumbar region: Secondary | ICD-10-CM | POA: Diagnosis not present

## 2017-09-01 DIAGNOSIS — Q72812 Congenital shortening of left lower limb: Secondary | ICD-10-CM | POA: Diagnosis not present

## 2017-09-01 DIAGNOSIS — M9903 Segmental and somatic dysfunction of lumbar region: Secondary | ICD-10-CM | POA: Diagnosis not present

## 2018-03-08 DIAGNOSIS — Z1231 Encounter for screening mammogram for malignant neoplasm of breast: Secondary | ICD-10-CM | POA: Diagnosis not present

## 2018-03-08 DIAGNOSIS — Z803 Family history of malignant neoplasm of breast: Secondary | ICD-10-CM | POA: Diagnosis not present

## 2018-03-09 DIAGNOSIS — H04123 Dry eye syndrome of bilateral lacrimal glands: Secondary | ICD-10-CM | POA: Diagnosis not present

## 2018-03-09 DIAGNOSIS — H53002 Unspecified amblyopia, left eye: Secondary | ICD-10-CM | POA: Diagnosis not present

## 2018-03-09 DIAGNOSIS — H5203 Hypermetropia, bilateral: Secondary | ICD-10-CM | POA: Diagnosis not present

## 2018-03-23 DIAGNOSIS — E039 Hypothyroidism, unspecified: Secondary | ICD-10-CM | POA: Diagnosis not present

## 2018-03-30 DIAGNOSIS — Z01419 Encounter for gynecological examination (general) (routine) without abnormal findings: Secondary | ICD-10-CM | POA: Diagnosis not present

## 2018-03-30 DIAGNOSIS — Z6830 Body mass index (BMI) 30.0-30.9, adult: Secondary | ICD-10-CM | POA: Diagnosis not present

## 2018-03-30 DIAGNOSIS — A609 Anogenital herpesviral infection, unspecified: Secondary | ICD-10-CM | POA: Diagnosis not present

## 2018-04-04 DIAGNOSIS — E782 Mixed hyperlipidemia: Secondary | ICD-10-CM | POA: Diagnosis not present

## 2018-04-04 DIAGNOSIS — Z9889 Other specified postprocedural states: Secondary | ICD-10-CM | POA: Diagnosis not present

## 2018-04-04 DIAGNOSIS — E669 Obesity, unspecified: Secondary | ICD-10-CM | POA: Diagnosis not present

## 2018-04-04 DIAGNOSIS — E039 Hypothyroidism, unspecified: Secondary | ICD-10-CM | POA: Diagnosis not present

## 2018-04-06 DIAGNOSIS — Z113 Encounter for screening for infections with a predominantly sexual mode of transmission: Secondary | ICD-10-CM | POA: Diagnosis not present

## 2018-04-06 DIAGNOSIS — Z13820 Encounter for screening for osteoporosis: Secondary | ICD-10-CM | POA: Diagnosis not present

## 2018-04-06 DIAGNOSIS — B009 Herpesviral infection, unspecified: Secondary | ICD-10-CM | POA: Diagnosis not present

## 2018-04-06 DIAGNOSIS — N762 Acute vulvitis: Secondary | ICD-10-CM | POA: Diagnosis not present

## 2018-04-07 ENCOUNTER — Other Ambulatory Visit: Payer: Self-pay | Admitting: Internal Medicine

## 2018-04-07 DIAGNOSIS — E041 Nontoxic single thyroid nodule: Secondary | ICD-10-CM

## 2018-04-11 ENCOUNTER — Ambulatory Visit
Admission: RE | Admit: 2018-04-11 | Discharge: 2018-04-11 | Disposition: A | Discharge status: Home / Self Care | Payer: BLUE CROSS/BLUE SHIELD | Source: Ambulatory Visit | Attending: Internal Medicine | Admitting: Internal Medicine

## 2018-04-11 DIAGNOSIS — E042 Nontoxic multinodular goiter: Secondary | ICD-10-CM | POA: Diagnosis not present

## 2018-04-11 DIAGNOSIS — E041 Nontoxic single thyroid nodule: Secondary | ICD-10-CM

## 2018-05-29 DIAGNOSIS — E782 Mixed hyperlipidemia: Secondary | ICD-10-CM | POA: Diagnosis not present

## 2018-05-29 DIAGNOSIS — E039 Hypothyroidism, unspecified: Secondary | ICD-10-CM | POA: Diagnosis not present

## 2018-06-01 DIAGNOSIS — E669 Obesity, unspecified: Secondary | ICD-10-CM | POA: Diagnosis not present

## 2018-06-01 DIAGNOSIS — E039 Hypothyroidism, unspecified: Secondary | ICD-10-CM | POA: Diagnosis not present

## 2018-06-01 DIAGNOSIS — E782 Mixed hyperlipidemia: Secondary | ICD-10-CM | POA: Diagnosis not present

## 2018-06-01 DIAGNOSIS — Z9889 Other specified postprocedural states: Secondary | ICD-10-CM | POA: Diagnosis not present

## 2018-09-12 ENCOUNTER — Other Ambulatory Visit: Payer: Self-pay | Admitting: *Deleted

## 2018-09-12 DIAGNOSIS — Z20822 Contact with and (suspected) exposure to covid-19: Secondary | ICD-10-CM

## 2018-09-13 ENCOUNTER — Other Ambulatory Visit: Payer: Self-pay

## 2018-09-13 DIAGNOSIS — Z20822 Contact with and (suspected) exposure to covid-19: Secondary | ICD-10-CM

## 2018-09-14 LAB — NOVEL CORONAVIRUS, NAA: SARS-CoV-2, NAA: NOT DETECTED

## 2019-08-22 IMAGING — US US THYROID
1 series · 14 of 25 positions shown · non-contrast
Comparison: None.

CLINICAL DATA: Goiter.  Left hemithyroidectomy in 8667.

EXAM:
THYROID ULTRASOUND
TECHNIQUE: Ultrasound examination of the thyroid gland and adjacent soft
tissues was performed.

[Series 1: us thyroid · 0.04mm/px · 14 of 45 slices shown]
[im 1/45]
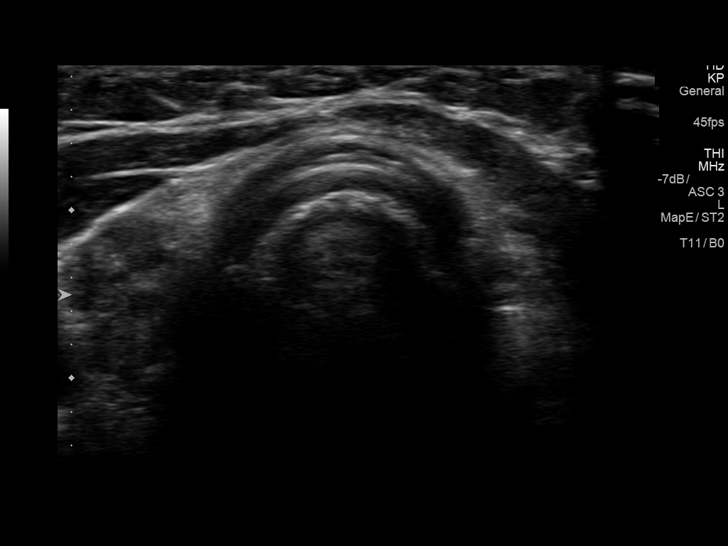
[im 4/45]
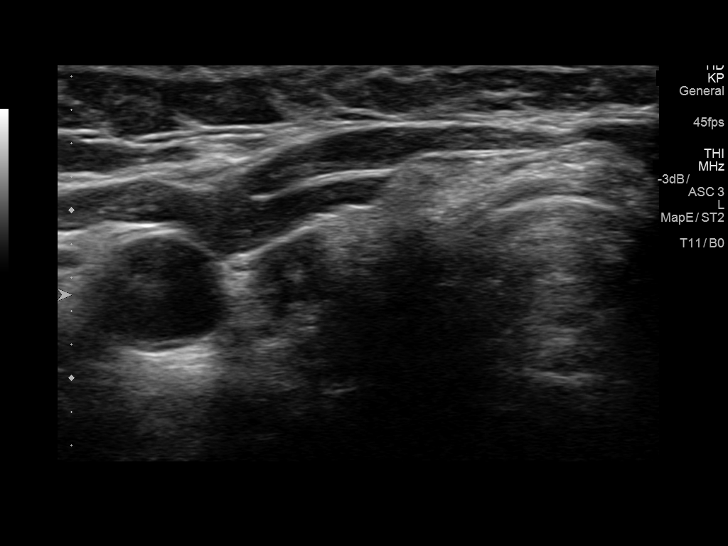
[im 8/45]
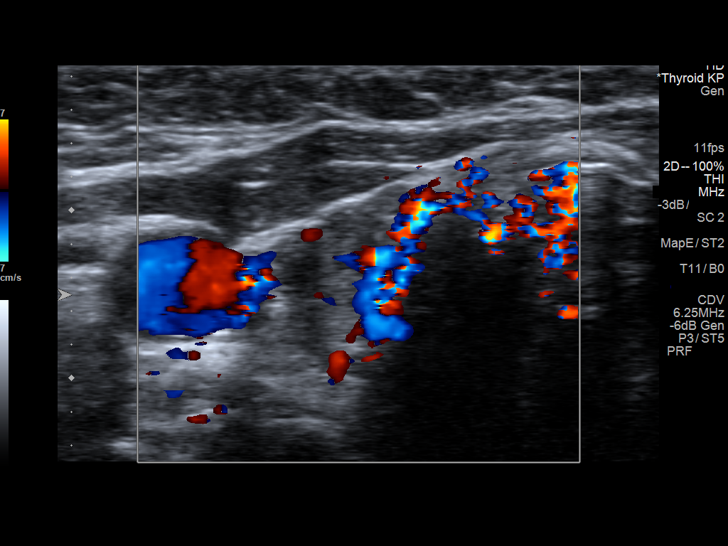
[im 12/45]
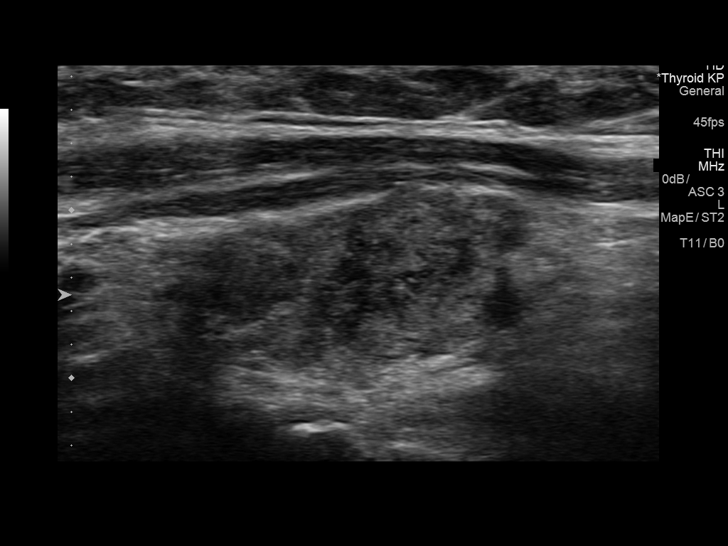
[im 15/45]
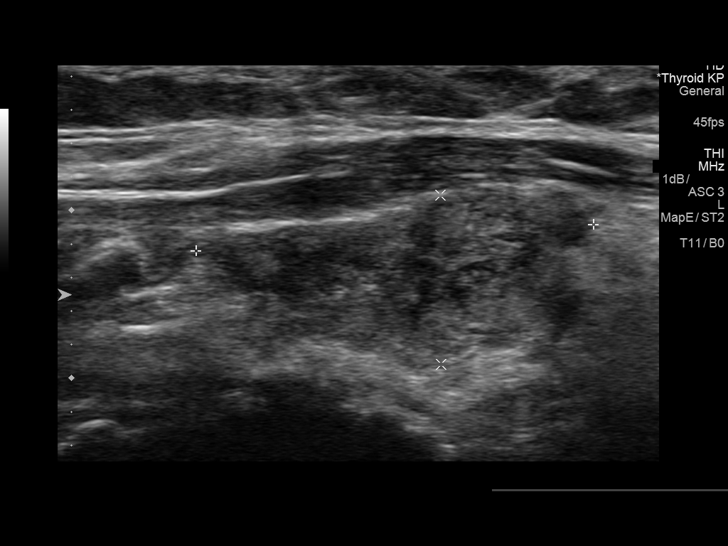
[im 17/45]
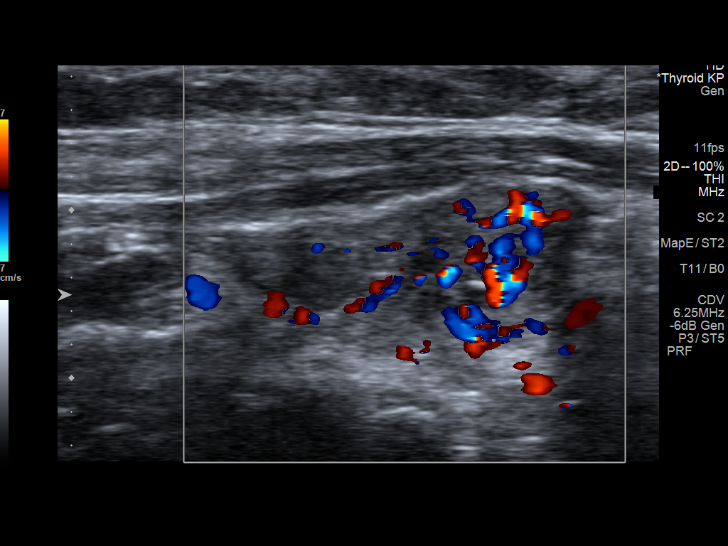
[im 21/45]
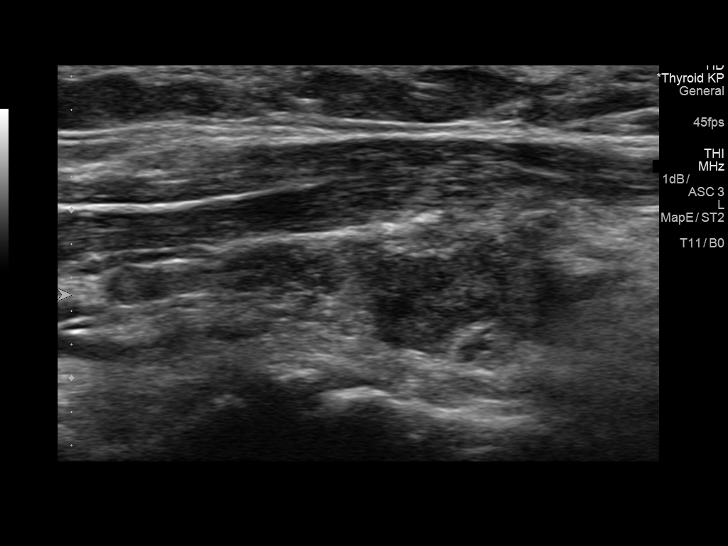
[im 24/45]
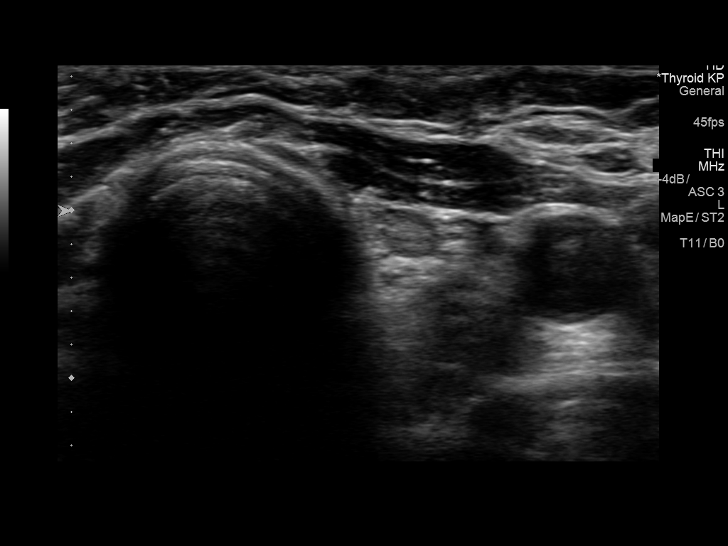
[im 28/45]
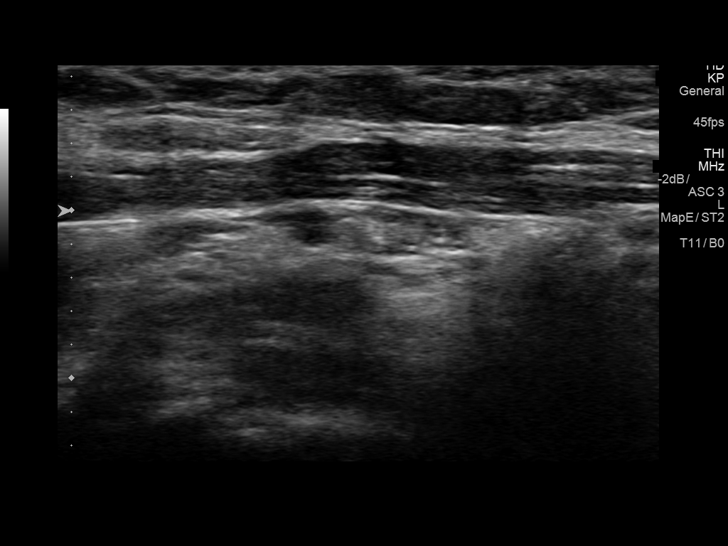
[im 30/45]
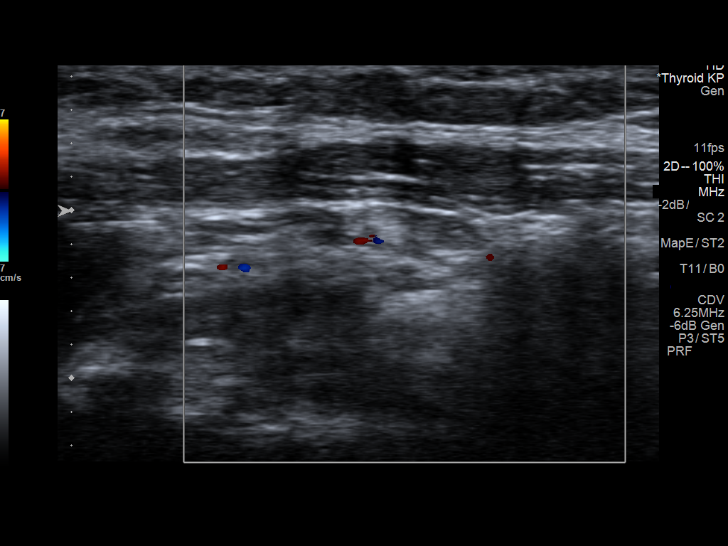
[im 34/45]
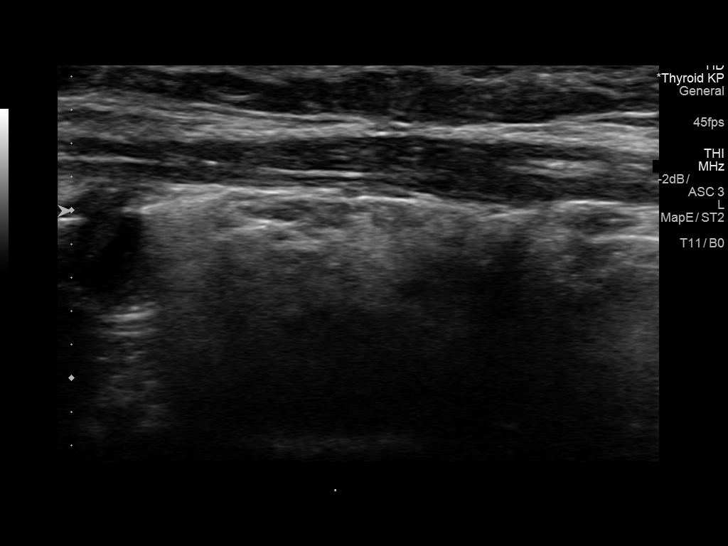
[im 37/45]
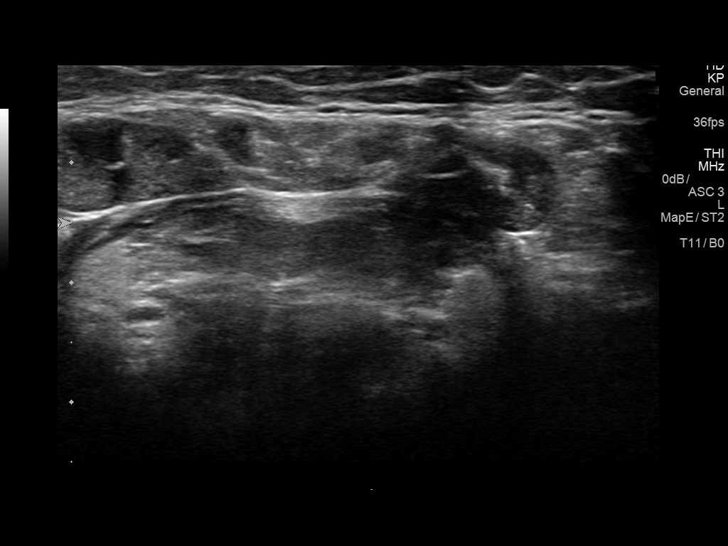
[im 41/45]
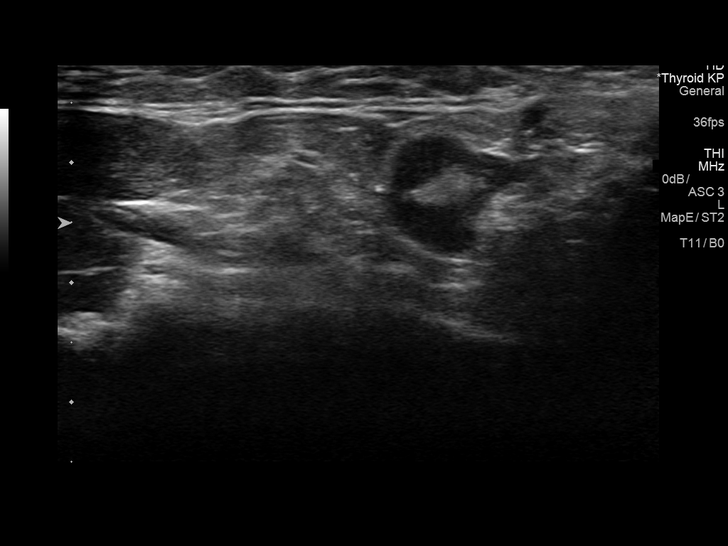
[im 45/45]
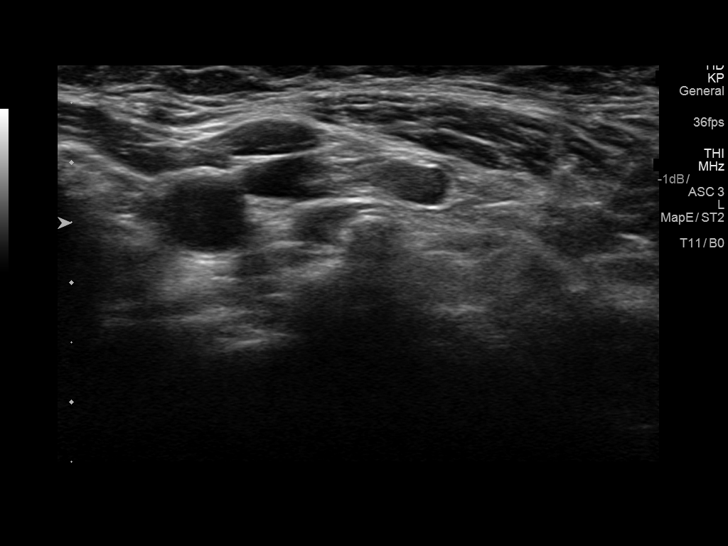

[14 of 25 positions shown; findings below may reference images not displayed]

FINDINGS: Parenchymal Echotexture: Markedly heterogenous

Isthmus: Absent tissue

Right lobe: 2.4 x 1.0 x 0.7 cm

Left lobe: 1.1 x 0.3 x 0.4 cm

_________________________________________________________

Estimated total number of nodules >/= 1 cm: 0

Number of spongiform nodules >/=  2 cm not described below (TR1): 0

Number of mixed cystic and solid nodules >/= 1.5 cm not described
below (TR2): 0

_________________________________________________________

No discrete nodules are seen within the thyroid gland.
IMPRESSION: There are no discrete nodules in the right lobe which meet criteria
for biopsy nor follow-up

After left hemithyroidectomy, there is residual tissue in the left
thyroid bed measuring 1.1 x 0.3 x 0.4 cm. If the patient had a
history of malignancy, recurrence can not be excluded.

The above is in keeping with the ACR TI-RADS recommendations - [HOSPITAL] 6670;[DATE].

## 2019-09-13 ENCOUNTER — Ambulatory Visit: Payer: 59 | Admitting: Cardiology

## 2019-09-13 ENCOUNTER — Other Ambulatory Visit: Payer: Self-pay

## 2019-09-13 ENCOUNTER — Encounter: Payer: Self-pay | Admitting: Cardiology

## 2019-09-13 VITALS — BP 149/86 | HR 70 | Ht 63.25 in | Wt 172.2 lb

## 2019-09-13 DIAGNOSIS — E782 Mixed hyperlipidemia: Secondary | ICD-10-CM

## 2019-09-13 DIAGNOSIS — Z8249 Family history of ischemic heart disease and other diseases of the circulatory system: Secondary | ICD-10-CM

## 2019-09-13 DIAGNOSIS — I10 Essential (primary) hypertension: Secondary | ICD-10-CM

## 2019-09-13 DIAGNOSIS — R002 Palpitations: Secondary | ICD-10-CM

## 2019-09-13 DIAGNOSIS — E6609 Other obesity due to excess calories: Secondary | ICD-10-CM

## 2019-09-13 DIAGNOSIS — E039 Hypothyroidism, unspecified: Secondary | ICD-10-CM

## 2019-09-13 NOTE — Progress Notes (Signed)
Date:  09/13/2019   ID:  Myrtis Hopping, DOB 24-Mar-1960, MRN [id number]  PCP:  Merri Brunette, MD  Cardiologist:  Tessa Lerner, DO, [facility] (established care 09/13/2019) Former Cardiology Providers: Dr. Royann Shivers  REASON FOR CONSULT: Palpitations  REQUESTING PHYSICIAN:  Excell Seltzer, MD [address] [place],  [place] [address]  Chief Complaint  Patient presents with  . New Patient (Initial Visit)  . Palpitations    HPI  [name] is a 59 y.o. female who presents to the office with a chief complaint of " palpitations." She is referred to the office at the request of Bedsole, Amy E, MD. Patient's past medical history and cardiovascular risk factors include: Hypertension, hyperlipidemia, hypothyroidism, family history of premature coronary disease.  Patient is referred to the office at the request of her primary care provider for evaluation and management of palpitations.  Patient states that she has been having palpitations for the last several years but recently has noted progression in the symptoms.  Patient symptoms are more noticeable at night when she wakes up.  She is not able to comment on how long the last for states that they are constant but she is able to go back to sleep despite the symptoms.  Patient denies any near-syncope or syncopal events.  No improving or worsening factors.  Patient is concerned that she could have atrial fibrillation as her mom's has it.  Patient denies use of herbal supplements, energy drinks, illicit drugs, smoking, no new over-the-counter medications, soda consumption.  She consumes 1 cup of coffee per day.  Her most recent thyroid function is within normal limits.  Family history of premature coronary artery disease, dad had his first angioplasty at the age of 74.   Denies prior history of coronary artery disease, myocardial infarction, congestive heart failure, deep venous thrombosis, pulmonary embolism, stroke, transient ischemic  attack.  FUNCTIONAL STATUS: No structured exercise program or daily routine.  ALLERGIES: Allergies  Allergen Reactions  . Amoxicillin     Yeast infection  . Sulfa Antibiotics Rash    MEDICATION LIST PRIOR TO VISIT: Current Meds  Medication Sig  . ALPRAZolam (XANAX) 0.5 MG tablet Take 0.5 mg by mouth at bedtime as needed. For anxiety/sleep  . famotidine (PEPCID) 20 MG tablet Take 40 mg by mouth at bedtime.   Marland Kitchen levothyroxine (SYNTHROID) 150 MCG tablet Take 1 tablet by mouth daily before breakfast.  . losartan-hydrochlorothiazide (HYZAAR) 100-25 MG tablet Take 0.5-1 tablets by mouth daily.  . rosuvastatin (CRESTOR) 20 MG tablet Take 20 mg by mouth daily.     PAST MEDICAL HISTORY: Past Medical History:  Diagnosis Date  . Family history of premature coronary artery disease   . Hyperlipidemia   . Hypertension   . Thyroid disease     PAST SURGICAL HISTORY: Past Surgical History:  Procedure Laterality Date  . ABDOMINAL HYSTERECTOMY    . APPENDECTOMY    . CARDIAC CATHETERIZATION    . CHOLECYSTECTOMY    . LEFT HEART CATHETERIZATION WITH CORONARY ANGIOGRAM N/A 12/03/2011   Procedure: LEFT HEART CATHETERIZATION WITH CORONARY ANGIOGRAM;  Surgeon: Thurmon Fair, MD;  Location: [facility] CATH LAB;  Service: Cardiovascular;  Laterality: N/A;  . THYROIDECTOMY      FAMILY HISTORY: The patient family history includes Aortic aneurysm in her father and mother; Crohn's disease in her sister; Heart attack in her mother; Heart disease in her father and mother; Hyperlipidemia in her brother; Hypertension in her mother; Lupus in her sister; Stroke in  her father.  SOCIAL HISTORY:  The patient  reports that she has never smoked. She has never used smokeless tobacco. She reports current alcohol use. She reports that she does not use drugs.  REVIEW OF SYSTEMS: Review of Systems  Constitutional: Negative for chills and fever.  HENT: Negative for hoarse voice and nosebleeds.   Eyes: Negative for  discharge, double vision and pain.  Cardiovascular: Positive for palpitations. Negative for chest pain, claudication, dyspnea on exertion, leg swelling, near-syncope, orthopnea, paroxysmal nocturnal dyspnea and syncope.  Respiratory: Negative for hemoptysis and shortness of breath.   Musculoskeletal: Negative for muscle cramps and myalgias.  Gastrointestinal: Negative for abdominal pain, constipation, diarrhea, hematemesis, hematochezia, melena, nausea and vomiting.  Neurological: Negative for dizziness and light-headedness.    PHYSICAL EXAM: Vitals with BMI 09/13/2019 12/03/2011 12/03/2011  Height 5' 3.25" - -  Weight 172 lbs 3 oz - -  BMI 30.25 - -  Systolic 149 103 [id number]  Diastolic 86 55 76  Pulse 70 68 74   CONSTITUTIONAL: Well-developed and well-nourished. No acute distress.  SKIN: Skin is warm and dry. No rash noted. No cyanosis. No pallor. No jaundice HEAD: Normocephalic and atraumatic.  EYES: No scleral icterus MOUTH/THROAT: Moist oral membranes.  NECK: No JVD present. No thyromegaly noted. No carotid bruits  LYMPHATIC: No visible cervical adenopathy.  CHEST Normal respiratory effort. No intercostal retractions  LUNGS: Clear to auscultation bilaterally.  No stridor. No wheezes. No rales.  CARDIOVASCULAR: Regular rate and rhythm, positive S1, S2, no murmurs rubs or gallops appreciated. ABDOMINAL: No apparent ascites.  EXTREMITIES: No peripheral edema  HEMATOLOGIC: No significant bruising NEUROLOGIC: Oriented to person, place, and time. Nonfocal. Normal muscle tone.  PSYCHIATRIC: Normal mood and affect. Normal behavior. Cooperative  CARDIAC DATABASE: EKG: 09/13/2019: Normal sinus rhythm, 65 bpm, normal axis, without underlying ischemia or injury pattern.  Echocardiogram: None  Stress Testing: None  Heart Catheterization: 11/2011: The left main coronary artery is free of significant atherosclerosis and trifurcates into the left anterior descending artery, a very large  ramus intermedius artery and the left circumflex coronary artery.  The left anterior descending artery is a large vessel that reaches the apex and generates single major diagonal branch. There is evidence of no luminal irregularities and no calcification. no hemodynamically meaningful stenoses are seen. The left circumflex coronary artery is a small-size vessel non- dominant vessel that generates a single major oblique marginal artery.  There is evidence of no luminal irregularities and no calcification. no hemodynamically meaningful stenoses seen. The right coronary artery is a large-size dominant vessel that generates a very long posterior lateral ventricular system as well as the PDA. There is evidence of no luminal irregularities and no calcification. no hemodynamically meaningful stenoses are seen.  The left ventricle is normal in size. The left ventricle systolic function is normal with an estimated ejection fraction of 55-60%. Regional wall motion abnormalities are not seen. No left ventricular thrombus is seen. There is no mitral insufficiency. The ascending aorta appears normal. There is no aortic valve stenosis by pullback. The left ventricular end-diastolic pressure is 1 mm Hg.   IMPRESSIONS: Normal coronary arteries, no evidence of structural cardiac   LABORATORY DATA: CBC Latest Ref Rng & Units 09/01/2009 09/20/2006 09/20/2006  WBC - - - 6.7  Hemoglobin 12.0 - 15.0 g/dL 09.613.5 04.514.3 40.913.3  Hematocrit - - 42.0 38.3  Platelets - - - 254    CMP 09/20/2006 09/20/2006  Glucose 94 85  BUN 10 12  Creatinine 0.79 1.0  Sodium 140 138  Potassium 3.7 4.0  Chloride 104 105  CO2 29 -  Calcium 9.1 -  Total Protein 6.6 -  Total Bilirubin 0.7 -  Alkaline Phos 47 -  AST 18 -  ALT 18 -    Lipid Panel     Component Value Date/Time   CHOL (H) 09/20/2006 1420    204        ATP III CLASSIFICATION:  <200     mg/dL   Desirable  299-242  mg/dL   Borderline High  >=683    mg/dL   High   TRIG 87  41/96/2229 1420   HDL 65 09/20/2006 1420   CHOLHDL 3.1 09/20/2006 1420   VLDL 17 09/20/2006 1420   LDLCALC (H) 09/20/2006 1420    122        Total Cholesterol/HDL:CHD Risk Coronary Heart Disease Risk Table                     Men   Women  1/2 Average Risk   3.4   3.3    No components found for: NTPROBNP No results for input(s): PROBNP in the last 8760 hours. No results for input(s): TSH in the last 8760 hours.  BMP No results for input(s): NA, K, CL, CO2, GLUCOSE, BUN, CREATININE, CALCIUM, GFRNONAA, GFRAA in the last 8760 hours.  HEMOGLOBIN A1C No results found for: HGBA1C, MPG   External Labs: Collected: 05/23/2019 Lipid profile: Total cholesterol 312, triglycerides 184, HDL 56, LDL 219, non-HDL 256 TSH: 7.98   IMPRESSION:    ICD-10-CM   1. Palpitations  R00.2 EKG 12-Lead    LONG TERM MONITOR (3-14 DAYS)  2. Benign hypertension  I10 PCV ECHOCARDIOGRAM COMPLETE  3. Mixed hyperlipidemia  E78.2   4. Family history of premature CAD  Z82.49 PCV ECHOCARDIOGRAM COMPLETE  5. Hypothyroidism, unspecified type  E03.9   6. Class 1 obesity due to excess calories with serious comorbidity and body mass index (BMI) of 30.0 to 30.9 in adult  E66.09    Z68.30      RECOMMENDATIONS: RAILEIGH SABATER is a 59 y.o. female whose past medical history and cardiac risk factors include: Hypertension, hyperlipidemia, hypothyroidism, family history of premature coronary disease.  Palpitations:  Patient states that the palpitations have been longstanding but nonprogressive.  She is concerned that she may have underlying atrial fibrillation similar to what her mother has.  She is here for evaluation.  No identifiable reversible causes noted.  TSH within normal limits.  EKG shows normal sinus rhythm without atrial or ventricular ectopy.  We will proceed with a 7-day extended Holter monitor to evaluate for underlying dysrhythmia/atrial fibrillation.  Benign essential hypertension:  Patient's  office blood pressures are not well controlled; however, improving.  Patient states that she is following up with PCP closely for medication titration and has upcoming follow-up visits.  Medication management per PCP.  Echocardiogram will be ordered to evaluate for structural heart disease and left ventricular systolic function.  Family history of premature coronary artery disease:   We will discussed with the patient in regards to proceeding with coronary artery calcification score and/or stress test at the next office visit given the fact that she has multiple cardiovascular risk factors.  Hyperlipidemia: Patient was initially reluctant to be on statin medications.  However based on the most recent lipid profile patient was agreeable to start Crestor.  Currently being followed by PCP.  Agree with initiation of statin therapy.  FINAL MEDICATION LIST  END OF ENCOUNTER: No orders of the defined types were placed in this encounter.    Current Outpatient Medications:  .  ALPRAZolam (XANAX) 0.5 MG tablet, Take 0.5 mg by mouth at bedtime as needed. For anxiety/sleep, Disp: , Rfl:  .  famotidine (PEPCID) 20 MG tablet, Take 40 mg by mouth at bedtime. , Disp: , Rfl:  .  levothyroxine (SYNTHROID) 150 MCG tablet, Take 1 tablet by mouth daily before breakfast., Disp: , Rfl:  .  losartan-hydrochlorothiazide (HYZAAR) 100-25 MG tablet, Take 0.5-1 tablets by mouth daily., Disp: , Rfl:  .  rosuvastatin (CRESTOR) 20 MG tablet, Take 20 mg by mouth daily., Disp: , Rfl:   Orders Placed This Encounter  Procedures  . LONG TERM MONITOR (3-14 DAYS)  . EKG 12-Lead  . PCV ECHOCARDIOGRAM COMPLETE    There are no Patient Instructions on file for this visit.   --Continue cardiac medications as reconciled in final medication list. --Return in about 6 weeks (around 10/25/2019) for re-evaluation of symptoms., review test results.. Or sooner if needed. --Continue follow-up with your primary care physician regarding the  management of your other chronic comorbid conditions.  Patient's questions and concerns were addressed to her satisfaction. She voices understanding of the instructions provided during this encounter.   This note was created using a voice recognition software as a result there may be grammatical errors inadvertently enclosed that do not reflect the nature of this encounter. Every attempt is made to correct such errors.  Tessa Lerner, [place], [facility]  Pager: [phone] Office: [phone]

## 2019-09-27 ENCOUNTER — Ambulatory Visit: Payer: 59

## 2019-09-27 ENCOUNTER — Other Ambulatory Visit: Payer: Self-pay

## 2019-09-27 DIAGNOSIS — Z8249 Family history of ischemic heart disease and other diseases of the circulatory system: Secondary | ICD-10-CM

## 2019-09-27 DIAGNOSIS — R002 Palpitations: Secondary | ICD-10-CM

## 2019-09-27 DIAGNOSIS — I10 Essential (primary) hypertension: Secondary | ICD-10-CM

## 2019-10-25 ENCOUNTER — Ambulatory Visit: Payer: 59 | Admitting: Cardiology

## 2019-10-25 ENCOUNTER — Other Ambulatory Visit: Payer: Self-pay

## 2019-10-25 ENCOUNTER — Encounter: Payer: Self-pay | Admitting: Cardiology

## 2019-10-25 VITALS — BP 120/75 | HR 72 | Resp 16 | Ht 63.0 in | Wt 172.0 lb

## 2019-10-25 DIAGNOSIS — E039 Hypothyroidism, unspecified: Secondary | ICD-10-CM

## 2019-10-25 DIAGNOSIS — I10 Essential (primary) hypertension: Secondary | ICD-10-CM

## 2019-10-25 DIAGNOSIS — E6609 Other obesity due to excess calories: Secondary | ICD-10-CM

## 2019-10-25 DIAGNOSIS — E782 Mixed hyperlipidemia: Secondary | ICD-10-CM

## 2019-10-25 DIAGNOSIS — R0609 Other forms of dyspnea: Secondary | ICD-10-CM

## 2019-10-25 DIAGNOSIS — Z8249 Family history of ischemic heart disease and other diseases of the circulatory system: Secondary | ICD-10-CM

## 2019-10-25 NOTE — Progress Notes (Signed)
ID:  [name], DOB 1960-03-15, MRN [id number]  PCP:  Merri BrunettePharr, Walter, MD  Cardiologist:  Tessa LernerSunit Bowen Kia, DO, [facility] (established care 09/13/2019) Former Cardiology Providers: Dr. Royann Shiversroitoru  Date: 10/25/2019 Last Office Visit: 09/13/2019  Chief Complaint  Patient presents with  . Palpitations  . Follow-up    6 week    HPI  [name] is a 59 y.o. female who presents to the office with a chief complaint of " palpitations follow-up and review test results." Patient's past medical history and cardiovascular risk factors include: Hypertension, hyperlipidemia, hypothyroidism, family history of premature coronary disease.  Patient is referred to the office at the request of her primary care provider for evaluation and management of palpitations.  Given her symptoms of palpitation discussed in the initial consultation patient was recommended to undergo echocardiogram and 7-day extended Holter monitor.  The echo noted preserved left ventricular systolic function without any significant valvular heart disease.  And 7-day extended Holter monitor number noted predominantly in normal sinus rhythm without any significant dysrhythmias.  Results noted below for further reference.  Since last visit patient states that his symptoms of palpitations have improved and are less frequent.  Her blood pressure has also been improved and currently being managed by primary team.  Despite better blood pressures and no significant arrhythmia noted on the extended Holter monitor patient continues to have symptoms of dyspnea on exertion.  She also states that at times she feels more tired and fatigued more than usual.  Patient does have family history of premature CAD father having his 1st angioplasty at the age of 57.  Since last visit, patient has also been started on Crestor by her PCP given her hyperlipidemia.  I reiterated the importance of statin therapy as her non-HDL is significantly elevated.  Patient  states that she was not able to tolerate Crestor given body aches and possible memory issues though she has been taking it very sporadically.   Despite normal blood pressures compared to prior she continues to have effort related dyspnea with exertional activities.  She denies effort related chest pain.  Family history of premature coronary artery disease, dad had his first angioplasty at the age of 57.   Denies prior history of coronary artery disease, myocardial infarction, congestive heart failure, deep venous thrombosis, pulmonary embolism, stroke, transient ischemic attack.  FUNCTIONAL STATUS: No structured exercise program or daily routine.  ALLERGIES: Allergies  Allergen Reactions  . Amoxicillin     Yeast infection  . Sulfa Antibiotics Rash    MEDICATION LIST PRIOR TO VISIT: Current Meds  Medication Sig  . ALPRAZolam (XANAX) 0.5 MG tablet Take 0.5 mg by mouth at bedtime as needed. For anxiety/sleep  . famotidine (PEPCID) 20 MG tablet Take 40 mg by mouth at bedtime.   Marland Kitchen. levothyroxine (SYNTHROID) 150 MCG tablet Take 1 tablet by mouth daily before breakfast.  . losartan-hydrochlorothiazide (HYZAAR) 100-25 MG tablet Take 0.5-1 tablets by mouth daily.     PAST MEDICAL HISTORY: Past Medical History:  Diagnosis Date  . Family history of premature coronary artery disease   . Hyperlipidemia   . Hypertension   . Thyroid disease     PAST SURGICAL HISTORY: Past Surgical History:  Procedure Laterality Date  . ABDOMINAL HYSTERECTOMY    . APPENDECTOMY    . CARDIAC CATHETERIZATION    . CHOLECYSTECTOMY    . LEFT HEART CATHETERIZATION WITH CORONARY ANGIOGRAM N/A 12/03/2011   Procedure: LEFT HEART CATHETERIZATION WITH CORONARY ANGIOGRAM;  Surgeon: Rachelle HoraMihai Croitoru,  MD;  Location: [facility] CATH [facility];  Service: Cardiovascular;  Laterality: N/A;  . THYROIDECTOMY      FAMILY HISTORY: The patient family history includes Aortic aneurysm in her father and mother; Crohn's disease in her sister;  Heart attack in her mother; Heart disease in her father and mother; Hyperlipidemia in her brother; Hypertension in her mother; Lupus in her sister; Stroke in her father.  SOCIAL HISTORY:  The patient  reports that she has never smoked. She has never used smokeless tobacco. She reports current alcohol use. She reports that she does not use drugs.  REVIEW OF SYSTEMS: Review of Systems  Constitutional: Negative for chills and fever.  HENT: Negative for hoarse voice and nosebleeds.   Eyes: Negative for discharge, double vision and pain.  Cardiovascular: Positive for dyspnea on exertion and palpitations (improving). Negative for chest pain, claudication, leg swelling, near-syncope, orthopnea, paroxysmal nocturnal dyspnea and syncope.  Respiratory: Negative for hemoptysis and shortness of breath.   Musculoskeletal: Negative for muscle cramps and myalgias.  Gastrointestinal: Negative for abdominal pain, constipation, diarrhea, hematemesis, hematochezia, melena, nausea and vomiting.  Neurological: Negative for dizziness and light-headedness.    PHYSICAL EXAM: Vitals with BMI 10/25/2019 09/13/2019 12/03/2011  Height 5\' 3"  5' 3.25" -  Weight 172 lbs 172 lbs 3 oz -  BMI 30.48 30.25 -  Systolic 120 149  Diastolic 75 86 55  Pulse 72 70 68   CONSTITUTIONAL: Well-developed and well-nourished. No acute distress.  SKIN: Skin is warm and dry. No rash noted. No cyanosis. No pallor. No jaundice HEAD: Normocephalic and atraumatic.  EYES: No scleral icterus MOUTH/THROAT: Moist oral membranes.  NECK: No JVD present. No thyromegaly noted. No carotid bruits  LYMPHATIC: No visible cervical adenopathy.  CHEST Normal respiratory effort. No intercostal retractions  LUNGS: Clear to auscultation bilaterally.  No stridor. No wheezes. No rales.  CARDIOVASCULAR: Regular rate and rhythm, positive S1, S2, no murmurs rubs or gallops appreciated. ABDOMINAL: No apparent ascites.  EXTREMITIES: No peripheral edema   HEMATOLOGIC: No significant bruising NEUROLOGIC: Oriented to person, place, and time. Nonfocal. Normal muscle tone.  PSYCHIATRIC: Normal mood and affect. Normal behavior. Cooperative  CARDIAC DATABASE: EKG: 09/13/2019: Normal sinus rhythm, 65 bpm, normal axis, without underlying ischemia or injury pattern.  Echocardiogram: 09/27/2019:  Left ventricle cavity is normal in size. Mild concentric hypertrophy of the left ventricle. Normal global wall motion. Normal LV systolic function with EF 56%. Normal diastolic filling pattern. Calculated EF 56%.  Mild tricuspid regurgitation. Estimated pulmonary artery systolic pressure 26 mmHg.   Stress Testing: None  Heart Catheterization: 11/2011: The left main coronary artery is free of significant atherosclerosis and trifurcates into the left anterior descending artery, a very large ramus intermedius artery and the left circumflex coronary artery.  The left anterior descending artery is a large vessel that reaches the apex and generates single major diagonal branch. There is evidence of no luminal irregularities and no calcification. no hemodynamically meaningful stenoses are seen. The left circumflex coronary artery is a small-size vessel non- dominant vessel that generates a single major oblique marginal artery.  There is evidence of no luminal irregularities and no calcification. no hemodynamically meaningful stenoses seen. The right coronary artery is a large-size dominant vessel that generates a very long posterior lateral ventricular system as well as the PDA. There is evidence of no luminal irregularities and no calcification. no hemodynamically meaningful stenoses are seen.  The left ventricle is normal in size. The left ventricle systolic function is normal with an  estimated ejection fraction of 55-60%. Regional wall motion abnormalities are not seen. No left ventricular thrombus is seen. There is no mitral insufficiency. The ascending aorta appears  normal. There is no aortic valve stenosis by pullback.   IMPRESSIONS: Normal coronary arteries, no evidence of structural cardiac   7 day extended Holter monitor:  Dominant rhythm normal sinus rhythm.  Heart rate 50-145 bpm. Average heart rate 75 bpm.  No atrial fibrillation/supraventricular tachycardia/non-sustained ventricular tachycardia/high grade AV block, pause (3 seconds or longer).  Total ventricular ectopic burden <1%.  Total supraventricular ectopic burden <1%.  Number of patient triggered events: 6. The underlying rhythm was either normal sinus or sinus tachycardia with rare ventricular ectopy. No significant dysrhythmias.   LABORATORY DATA: CBC Latest Ref Rng & Units 09/01/2009 09/20/2006 09/20/2006  WBC - - - 6.7  Hemoglobin 12.0 - 15.0 g/dL 07.6 80.8 81.1  Hematocrit - - 42.0 38.3  Platelets - - - 254    CMP 09/20/2006 09/20/2006  Glucose 94 85  BUN 10 12  Creatinine 0.79 1.0  Sodium 140 138  Potassium 3.7 4.0  Chloride 104 105  CO2 29 -  Calcium 9.1 -  Total Protein 6.6 -  Total Bilirubin 0.7 -  Alkaline Phos 47 -  AST 18 -  ALT 18 -    Lipid Panel     Component Value Date/Time   CHOL (H) 09/20/2006 1420    204        ATP III CLASSIFICATION:  <200     mg/dL   Desirable  031-594  mg/dL   Borderline High  >=585    mg/dL   High   TRIG 87 92/92/4462 1420   HDL 65 09/20/2006 1420   CHOLHDL 3.1 09/20/2006 1420   VLDL 17 09/20/2006 1420   LDLCALC (H) 09/20/2006 1420    122        Total Cholesterol/HDL:CHD Risk Coronary Heart Disease Risk Table                     Men   Women  1/2 Average Risk   3.4   3.3    No components found for: NTPROBNP No results for input(s): PROBNP in the last 8760 hours. No results for input(s): TSH in the last 8760 hours.  BMP No results for input(s): NA, K, CL, CO2, GLUCOSE, BUN, CREATININE, CALCIUM, GFRNONAA, GFRAA in the last 8760 hours.  HEMOGLOBIN A1C No results found for: HGBA1C, MPG   External Labs: Collected:  05/23/2019 Lipid profile: Total cholesterol 312, triglycerides 184, HDL 56, LDL 219, non-HDL 256 TSH: 8.63   IMPRESSION:    ICD-10-CM   1. Dyspnea on exertion  R06.00 PCV MYOCARDIAL PERFUSION WO LEXISCAN  2. Benign hypertension  I10   3. Mixed hyperlipidemia  E78.2   4. Family history of premature CAD  Z82.49   5. Hypothyroidism, unspecified type  E03.9   6. Class 1 obesity due to excess calories with serious comorbidity and body mass index (BMI) of 30.0 to 30.9 in adult  E66.09    Z68.30      RECOMMENDATIONS: [name] is a 59 y.o. female whose past medical history and cardiac risk factors include: Hypertension, hyperlipidemia, hypothyroidism, family history of premature coronary disease.  Dyspnea on exertion:  Despite better blood pressure management and no significant dysrhythmias noted on 7-day extended Holter monitor before continued symptoms of effort related dyspnea.  In the setting of effort related dyspnea, multiple cardiovascular risk factors, uncontrolled hyperlipidemia recommended  stress test to evaluate for exercise-induced ischemia.  Educated on the importance of improving risk factors.  Palpitations:  Improved since last visit.  Reviewed the results of the 7-day extended Holter monitor with the patient.  Findings noted above.  No identifiable reversible causes noted.  TSH within normal limits.  Continue to monitor.  Benign essential hypertension:  Office blood pressure is very well controlled.  Currently managed by PCP.  Family history of premature coronary artery disease:   Shared decision was to proceed with stress test to evaluate for reversible ischemia.  Further recommendations to follow.  Hyperlipidemia:   Since last visit patient states that she was started on Crestor by her PCP due to uncontrolled hyperlipidemia.   I also encouraged the patient that statin therapy is essential as her non-HDL is 256 and LDL 219.   Currently she is  taking Crestor sporadically.  She states that she has an upcoming appointment with her PCP and will discuss alternative options.    Follow-up visit in 1 year or sooner if symptoms become progressive or stress test is abnormal.  FINAL MEDICATION LIST END OF ENCOUNTER: No orders of the defined types were placed in this encounter.    Current Outpatient Medications:  .  ALPRAZolam (XANAX) 0.5 MG tablet, Take 0.5 mg by mouth at bedtime as needed. For anxiety/sleep, Disp: , Rfl:  .  famotidine (PEPCID) 20 MG tablet, Take 40 mg by mouth at bedtime. , Disp: , Rfl:  .  levothyroxine (SYNTHROID) 150 MCG tablet, Take 1 tablet by mouth daily before breakfast., Disp: , Rfl:  .  losartan-hydrochlorothiazide (HYZAAR) 100-25 MG tablet, Take 0.5-1 tablets by mouth daily., Disp: , Rfl:  .  rosuvastatin (CRESTOR) 20 MG tablet, Take 20 mg by mouth daily., Disp: , Rfl:   Orders Placed This Encounter  Procedures  . PCV MYOCARDIAL PERFUSION WO LEXISCAN    There are no Patient Instructions on file for this visit.   --Continue cardiac medications as reconciled in final medication list. --Return in about 1 year (around 10/24/2020) for Reevaluation of symptoms. . Or sooner if needed. --Continue follow-up with your primary care physician regarding the management of your other chronic comorbid conditions.  Patient's questions and concerns were addressed to her satisfaction. She voices understanding of the instructions provided during this encounter.   This note was created using a voice recognition software as a result there may be grammatical errors inadvertently enclosed that do not reflect the nature of this encounter. Every attempt is made to correct such errors.  Tessa Lerner, [place], [facility]  Pager: [phone] Office: [phone]

## 2019-10-31 ENCOUNTER — Ambulatory Visit: Payer: 59

## 2019-10-31 ENCOUNTER — Other Ambulatory Visit: Payer: Self-pay

## 2019-10-31 DIAGNOSIS — R0609 Other forms of dyspnea: Secondary | ICD-10-CM

## 2020-10-02 NOTE — Telephone Encounter (Signed)
From patient.

## 2020-10-24 ENCOUNTER — Ambulatory Visit: Payer: 59 | Admitting: Cardiology

## 2021-04-03 ENCOUNTER — Encounter (HOSPITAL_BASED_OUTPATIENT_CLINIC_OR_DEPARTMENT_OTHER): Payer: Self-pay | Admitting: Emergency Medicine

## 2021-04-03 ENCOUNTER — Emergency Department (HOSPITAL_BASED_OUTPATIENT_CLINIC_OR_DEPARTMENT_OTHER)
Admission: EM | Admit: 2021-04-03 | Discharge: 2021-04-03 | Disposition: A | Discharge status: Home / Self Care | Payer: 59 | Attending: Emergency Medicine | Admitting: Emergency Medicine

## 2021-04-03 ENCOUNTER — Other Ambulatory Visit: Payer: Self-pay

## 2021-04-03 ENCOUNTER — Emergency Department (HOSPITAL_BASED_OUTPATIENT_CLINIC_OR_DEPARTMENT_OTHER): Payer: 59

## 2021-04-03 DIAGNOSIS — M79605 Pain in left leg: Secondary | ICD-10-CM | POA: Diagnosis not present

## 2021-04-03 DIAGNOSIS — E039 Hypothyroidism, unspecified: Secondary | ICD-10-CM | POA: Insufficient documentation

## 2021-04-03 DIAGNOSIS — I10 Essential (primary) hypertension: Secondary | ICD-10-CM | POA: Insufficient documentation

## 2021-04-03 DIAGNOSIS — M79662 Pain in left lower leg: Secondary | ICD-10-CM | POA: Diagnosis not present

## 2021-04-03 DIAGNOSIS — Z79899 Other long term (current) drug therapy: Secondary | ICD-10-CM | POA: Insufficient documentation

## 2021-04-03 DIAGNOSIS — E876 Hypokalemia: Secondary | ICD-10-CM | POA: Diagnosis not present

## 2021-04-03 LAB — CBC WITH DIFFERENTIAL/PLATELET
Abs Immature Granulocytes: 0.01 10*3/uL (ref 0.00–0.07)
Basophils Absolute: 0 10*3/uL (ref 0.0–0.1)
Basophils Relative: 1 %
Eosinophils Absolute: 0 10*3/uL (ref 0.0–0.5)
Eosinophils Relative: 1 %
HCT: 38.7 % (ref 36.0–46.0)
Hemoglobin: 13 g/dL (ref 12.0–15.0)
Immature Granulocytes: 0 %
Lymphocytes Relative: 38 %
Lymphs Abs: 1.7 10*3/uL (ref 0.7–4.0)
MCH: 32.4 pg (ref 26.0–34.0)
MCHC: 33.6 g/dL (ref 30.0–36.0)
MCV: 96.5 fL (ref 80.0–100.0)
Monocytes Absolute: 0.6 10*3/uL (ref 0.1–1.0)
Monocytes Relative: 13 %
Neutro Abs: 2.1 10*3/uL (ref 1.7–7.7)
Neutrophils Relative %: 47 %
Platelets: 203 10*3/uL (ref 150–400)
RBC: 4.01 MIL/uL (ref 3.87–5.11)
RDW: 11.8 % (ref 11.5–15.5)
WBC: 4.4 10*3/uL (ref 4.0–10.5)
nRBC: 0 % (ref 0.0–0.2)

## 2021-04-03 LAB — BASIC METABOLIC PANEL
Anion gap: 11 (ref 5–15)
BUN: 16 mg/dL (ref 6–20)
CO2: 29 mmol/L (ref 22–32)
Calcium: 9.8 mg/dL (ref 8.9–10.3)
Chloride: 99 mmol/L (ref 98–111)
Creatinine, Ser: 0.58 mg/dL (ref 0.44–1.00)
GFR, Estimated: >60 mL/min (ref >60)
Glucose, Bld: 97 mg/dL (ref 70–99)
Potassium: 3.2 mmol/L — ABNORMAL LOW (ref 3.5–5.1)
Sodium: 139 mmol/L (ref 135–145)

## 2021-04-03 MED ORDER — POTASSIUM CHLORIDE 20 MEQ PO PACK
40.0000 meq | PACK | Freq: Two times a day (BID) | ORAL | Status: DC
  Administered 2021-04-03: 40 meq via ORAL
  Filled 2021-04-03: qty 2

## 2021-04-03 NOTE — Discharge Instructions (Signed)
Your ultrasound was negative for a blood clot today.  This is good news.  I would like for you to follow-up with your primary care doctor for further evaluation.  Return to the emergency department for worsening symptoms.

## 2021-04-03 NOTE — ED Triage Notes (Signed)
Pt c/o left lower leg pain. Describes pain as a throbbing and was told by PCP to come here to rule out clot.

## 2021-04-03 NOTE — ED Notes (Signed)
Patient given discharge instructions. Questions were answered. Patient verbalized understanding of discharge instructions and care at home.  

## 2021-04-03 NOTE — ED Provider Notes (Signed)
°  Physical Exam  BP 120/75    Pulse 70    Temp 98.1 F (36.7 C) (Oral)    Resp 18    Ht 5\' 3"  (1.6 m)    Wt 76.7 kg    SpO2 96%    BMI 29.94 kg/m   Physical Exam Vitals and nursing note reviewed.  Constitutional:      General: She is not in acute distress.    Appearance: Normal appearance.  HENT:     Head: Normocephalic and atraumatic.  Eyes:     General:        Right eye: No discharge.        Left eye: No discharge.  Cardiovascular:     Comments: Regular rate and rhythm.  S1/S2 are distinct without any evidence of murmur, rubs, or gallops.  Radial pulses are 2+ bilaterally.  Dorsalis pedis pulses are 2+ bilaterally.  No evidence of pedal edema. Pulmonary:     Comments: Clear to auscultation bilaterally.  Normal effort.  No respiratory distress.  No evidence of wheezes, rales, or rhonchi heard throughout. Abdominal:     General: Abdomen is flat. Bowel sounds are normal. There is no distension.     Tenderness: There is no abdominal tenderness. There is no guarding or rebound.  Musculoskeletal:        General: Normal range of motion.     Cervical back: Neck supple.     Comments: No calf tenderness on the left.  Negative Homans' sign.  No swelling of the left lower leg.  Strong 2+ dorsalis pedis pulse felt on the left.  Skin:    General: Skin is warm and dry.     Findings: No rash.  Neurological:     General: No focal deficit present.     Mental Status: She is alert.  Psychiatric:        Mood and Affect: Mood normal.        Behavior: Behavior normal.    Procedures  Procedures  ED Course / MDM    Medical Decision Making Amount and/or Complexity of Data Reviewed Labs: ordered. ECG/medicine tests: ordered.  Risk Prescription drug management.   Accepted handoff at shift change from [facility]. Please see prior provider note for more detail.   Briefly: Patient is 61 y.o. female who is coming in to the emergency department with a 2-day history of left lower leg pain.   Patient had COVID last week and saw her primary care doctor today who sent her to the emergency department to rule out blood clot.  No history of trauma or injury to the leg.  No chest pain or shortness of breath.  DDX: concern for DVT.  I would low suspicion for cellulitis, compartment syndrome, arterial thrombus.  Could be muscular strain.  Plan: Ultrasound is negative for DVT.  Patient was found to be hypokalemic in the department.  This was repleted.  Overall, patient is well-appearing with normal vital signs.  She is safe to follow-up in the outpatient setting.  Strict return precautions were given to the patient at bedside.  She expressed full understanding.  She is safe for discharge           46 04/03/21 1656    04/05/21, MD 04/04/21 [phone]

## 2021-04-03 NOTE — ED Provider Notes (Signed)
[facility] EMERGENCY DEPT Provider Note   CSN: [id number] Arrival date & time: 04/03/21  1300     History  Chief Complaint  Patient presents with   Leg Pain    [name] is a 61 y.o. female with a past medical history significant for hypothyroidism, acid reflux, hypertension, hyperlipidemia who presents with concern for left lower leg calf pain/throbbing.  Patient was seen and evaluated by PCP, and was told to come and evaluate for blood clot.  Patient denies any numbness, tingling, redness, swelling.  Patient denies any difficulty breathing, cough, hemoptysis.  She is not tachycardic on arrival.   Leg Pain     Home Medications Prior to Admission medications   Medication Sig Start Date End Date Taking? Authorizing Provider  ALPRAZolam Prudy Feeler) 0.5 MG tablet Take 0.5 mg by mouth at bedtime as needed. For anxiety/sleep    [provider]  famotidine (PEPCID) 20 MG tablet Take 40 mg by mouth at bedtime.     [provider]  levothyroxine (SYNTHROID) 150 MCG tablet Take 1 tablet by mouth daily before breakfast.    [provider]  losartan-hydrochlorothiazide (HYZAAR) 100-25 MG tablet Take 0.5-1 tablets by mouth daily. 08/14/19   [provider]  rosuvastatin (CRESTOR) 20 MG tablet Take 20 mg by mouth daily. 08/14/19   [provider]      Allergies    Amoxicillin and Sulfa antibiotics    Review of Systems   Review of Systems  Cardiovascular:  Positive for leg swelling.  All other systems reviewed and are negative.  Physical Exam Updated Vital Signs BP (!) 124/93 (BP Location: Right Arm)    Pulse 74    Temp 98.1 F (36.7 C) (Oral)    Resp 18    Ht 5\' 3"  (1.6 m)    Wt 76.7 kg    SpO2 99%    BMI 29.94 kg/m  Physical Exam Vitals and nursing note reviewed.  Constitutional:      General: She is not in acute distress.    Appearance: Normal appearance.  HENT:     Head: Normocephalic and atraumatic.  Eyes:      General:        Right eye: No discharge.        Left eye: No discharge.  Cardiovascular:     Rate and Rhythm: Normal rate and regular rhythm.  Pulmonary:     Effort: Pulmonary effort is normal. No respiratory distress.  Musculoskeletal:        General: No deformity.     Comments: No significant swelling, tenderness, redness of the left posterior calf.  Negative Homans' sign.  Skin:    General: Skin is warm and dry.  Neurological:     Mental Status: She is alert and oriented to person, place, and time.  Psychiatric:        Mood and Affect: Mood normal.        Behavior: Behavior normal.    ED Results / Procedures / Treatments   Labs (all labs ordered are listed, but only abnormal results are displayed) Labs Reviewed  CBC WITH DIFFERENTIAL/PLATELET  BASIC METABOLIC PANEL    EKG None  Radiology No results found.  Procedures Procedures    Medications Ordered in ED Medications - No data to display  ED Course/ Medical Decision Making/ A&P                           Medical  Decision Making Amount and/or Complexity of Data Reviewed Labs: ordered. ECG/medicine tests: ordered.  Risk Prescription drug management.   This patient with no significant medical history, with no evidence of calf swelling, redness, but with left calf tenderness, throbbing sensation who was sent by her PCP to evaluate for DVT.  The emergent differential diagnosis includes acute DVT, with progression to PE, musculoskeletal injury, versus other.  This is not an exhaustive differential.  Clinically on exam I see no evidence of redness, swelling, and patient does not have significant tenderness of the left calf.  She has clear lung sounds bilaterally, no tachycardia.  She has no history of clotting disorder, no history of blood clots, and is overall well-appearing.  Discussed with patient that ultrasound services were not here until 4 PM, however patient requests that she stay here for the evaluation, as  she does not want to "wait in [facility] waiting room for 12 hours".  My exam patient is overall well-appearing, I do not necessarily see clinical signs of a DVT, however with throbbing leg pain, and endorsement that she should be checked out by her PCP I do believe it is reasonable for her to obtain an ultrasound to rule out DVT at this time.  3:33 PM Care of CARRELL PALMATIER transferred to [facility] and Dr. Charm Barges at the end of my shift as the patient will require reassessment once labs/imaging have resulted. Patient presentation, ED course, and plan of care discussed with review of all pertinent labs and imaging. Please see his/her note for further details regarding further ED course and disposition. Plan at time of handoff is dispo pending DVT study. This may be altered or completely changed at the discretion of the oncoming team pending results of further workup.  Final Clinical Impression(s) / ED Diagnoses Final diagnoses:  None    Rx / DC Orders ED Discharge Orders     None         [place] 04/03/21 1533    Gwyneth Sprout, MD 04/04/21 1437

## 2021-04-30 DIAGNOSIS — Z0000 Encounter for general adult medical examination without abnormal findings: Secondary | ICD-10-CM | POA: Diagnosis not present

## 2021-05-04 DIAGNOSIS — E039 Hypothyroidism, unspecified: Secondary | ICD-10-CM | POA: Diagnosis not present

## 2021-05-04 DIAGNOSIS — K219 Gastro-esophageal reflux disease without esophagitis: Secondary | ICD-10-CM | POA: Diagnosis not present

## 2021-05-04 DIAGNOSIS — Z8249 Family history of ischemic heart disease and other diseases of the circulatory system: Secondary | ICD-10-CM | POA: Diagnosis not present

## 2021-05-04 DIAGNOSIS — Z6830 Body mass index (BMI) 30.0-30.9, adult: Secondary | ICD-10-CM | POA: Diagnosis not present

## 2021-05-04 DIAGNOSIS — Z0000 Encounter for general adult medical examination without abnormal findings: Secondary | ICD-10-CM | POA: Diagnosis not present

## 2021-05-04 DIAGNOSIS — E782 Mixed hyperlipidemia: Secondary | ICD-10-CM | POA: Diagnosis not present

## 2021-06-04 DIAGNOSIS — E7800 Pure hypercholesterolemia, unspecified: Secondary | ICD-10-CM | POA: Diagnosis not present

## 2021-06-12 ENCOUNTER — Other Ambulatory Visit (HOSPITAL_COMMUNITY): Payer: Self-pay

## 2021-06-12 MED ORDER — PRALUENT 75 MG/ML ~~LOC~~ SOAJ
SUBCUTANEOUS | 11 refills | Status: DC
  Filled 2021-06-12 – 2021-07-15 (×2): qty 2, 28d supply, fill #0
  Filled 2021-08-05: qty 2, 30d supply, fill #0

## 2021-07-10 ENCOUNTER — Other Ambulatory Visit (HOSPITAL_COMMUNITY): Payer: Self-pay

## 2021-07-11 ENCOUNTER — Other Ambulatory Visit (HOSPITAL_COMMUNITY): Payer: Self-pay

## 2021-07-15 ENCOUNTER — Other Ambulatory Visit (HOSPITAL_COMMUNITY): Payer: Self-pay

## 2021-07-27 ENCOUNTER — Other Ambulatory Visit (HOSPITAL_COMMUNITY): Payer: Self-pay

## 2021-08-03 DIAGNOSIS — D2261 Melanocytic nevi of right upper limb, including shoulder: Secondary | ICD-10-CM | POA: Diagnosis not present

## 2021-08-03 DIAGNOSIS — D485 Neoplasm of uncertain behavior of skin: Secondary | ICD-10-CM | POA: Diagnosis not present

## 2021-08-03 DIAGNOSIS — C44519 Basal cell carcinoma of skin of other part of trunk: Secondary | ICD-10-CM | POA: Diagnosis not present

## 2021-08-03 DIAGNOSIS — L821 Other seborrheic keratosis: Secondary | ICD-10-CM | POA: Diagnosis not present

## 2021-08-03 DIAGNOSIS — L570 Actinic keratosis: Secondary | ICD-10-CM | POA: Diagnosis not present

## 2021-08-03 DIAGNOSIS — D225 Melanocytic nevi of trunk: Secondary | ICD-10-CM | POA: Diagnosis not present

## 2021-08-03 DIAGNOSIS — D2262 Melanocytic nevi of left upper limb, including shoulder: Secondary | ICD-10-CM | POA: Diagnosis not present

## 2021-08-05 ENCOUNTER — Other Ambulatory Visit (HOSPITAL_COMMUNITY): Payer: Self-pay

## 2021-08-12 DIAGNOSIS — E039 Hypothyroidism, unspecified: Secondary | ICD-10-CM | POA: Diagnosis not present

## 2021-08-12 DIAGNOSIS — I10 Essential (primary) hypertension: Secondary | ICD-10-CM | POA: Diagnosis not present

## 2021-08-12 DIAGNOSIS — E042 Nontoxic multinodular goiter: Secondary | ICD-10-CM | POA: Diagnosis not present

## 2021-08-12 DIAGNOSIS — E782 Mixed hyperlipidemia: Secondary | ICD-10-CM | POA: Diagnosis not present

## 2021-08-12 DIAGNOSIS — E669 Obesity, unspecified: Secondary | ICD-10-CM | POA: Diagnosis not present

## 2021-08-14 DIAGNOSIS — E039 Hypothyroidism, unspecified: Secondary | ICD-10-CM | POA: Diagnosis not present

## 2021-08-14 DIAGNOSIS — E669 Obesity, unspecified: Secondary | ICD-10-CM | POA: Diagnosis not present

## 2021-09-03 DIAGNOSIS — Z1231 Encounter for screening mammogram for malignant neoplasm of breast: Secondary | ICD-10-CM | POA: Diagnosis not present

## 2021-09-07 DIAGNOSIS — C4441 Basal cell carcinoma of skin of scalp and neck: Secondary | ICD-10-CM | POA: Diagnosis not present

## 2021-11-12 DIAGNOSIS — E039 Hypothyroidism, unspecified: Secondary | ICD-10-CM | POA: Diagnosis not present

## 2022-01-15 DIAGNOSIS — E039 Hypothyroidism, unspecified: Secondary | ICD-10-CM | POA: Diagnosis not present

## 2022-03-03 ENCOUNTER — Other Ambulatory Visit (HOSPITAL_COMMUNITY): Payer: Self-pay

## 2022-03-03 DIAGNOSIS — Z789 Other specified health status: Secondary | ICD-10-CM | POA: Diagnosis not present

## 2022-03-03 DIAGNOSIS — E7801 Familial hypercholesterolemia: Secondary | ICD-10-CM | POA: Diagnosis not present

## 2022-03-03 DIAGNOSIS — M5442 Lumbago with sciatica, left side: Secondary | ICD-10-CM | POA: Diagnosis not present

## 2022-03-03 DIAGNOSIS — M5441 Lumbago with sciatica, right side: Secondary | ICD-10-CM | POA: Diagnosis not present

## 2022-03-15 ENCOUNTER — Ambulatory Visit (HOSPITAL_COMMUNITY)
Admission: RE | Admit: 2022-03-15 | Discharge: 2022-03-15 | Disposition: A | Discharge status: Home / Self Care | Payer: 59 | Source: Ambulatory Visit

## 2022-03-15 DIAGNOSIS — E7801 Familial hypercholesterolemia: Secondary | ICD-10-CM | POA: Insufficient documentation

## 2022-05-26 DIAGNOSIS — Z0000 Encounter for general adult medical examination without abnormal findings: Secondary | ICD-10-CM | POA: Diagnosis not present

## 2022-06-03 DIAGNOSIS — R7303 Prediabetes: Secondary | ICD-10-CM | POA: Diagnosis not present

## 2022-06-03 DIAGNOSIS — I10 Essential (primary) hypertension: Secondary | ICD-10-CM | POA: Diagnosis not present

## 2022-06-03 DIAGNOSIS — E7801 Familial hypercholesterolemia: Secondary | ICD-10-CM | POA: Diagnosis not present

## 2022-06-03 DIAGNOSIS — I2510 Atherosclerotic heart disease of native coronary artery without angina pectoris: Secondary | ICD-10-CM | POA: Diagnosis not present

## 2022-06-03 DIAGNOSIS — R7301 Impaired fasting glucose: Secondary | ICD-10-CM | POA: Diagnosis not present

## 2022-06-03 DIAGNOSIS — E039 Hypothyroidism, unspecified: Secondary | ICD-10-CM | POA: Diagnosis not present

## 2022-06-03 DIAGNOSIS — Z86010 Personal history of colonic polyps: Secondary | ICD-10-CM | POA: Diagnosis not present

## 2022-06-03 DIAGNOSIS — K219 Gastro-esophageal reflux disease without esophagitis: Secondary | ICD-10-CM | POA: Diagnosis not present

## 2022-06-03 DIAGNOSIS — Z0000 Encounter for general adult medical examination without abnormal findings: Secondary | ICD-10-CM | POA: Diagnosis not present

## 2022-06-14 DIAGNOSIS — D519 Vitamin B12 deficiency anemia, unspecified: Secondary | ICD-10-CM | POA: Diagnosis not present

## 2022-06-14 DIAGNOSIS — E782 Mixed hyperlipidemia: Secondary | ICD-10-CM | POA: Diagnosis not present

## 2022-06-14 DIAGNOSIS — E039 Hypothyroidism, unspecified: Secondary | ICD-10-CM | POA: Diagnosis not present

## 2022-07-02 ENCOUNTER — Other Ambulatory Visit: Payer: Self-pay | Admitting: Internal Medicine

## 2022-07-02 DIAGNOSIS — Z9889 Other specified postprocedural states: Secondary | ICD-10-CM | POA: Diagnosis not present

## 2022-07-02 DIAGNOSIS — R7303 Prediabetes: Secondary | ICD-10-CM | POA: Diagnosis not present

## 2022-07-02 DIAGNOSIS — E039 Hypothyroidism, unspecified: Secondary | ICD-10-CM | POA: Diagnosis not present

## 2022-07-02 DIAGNOSIS — E042 Nontoxic multinodular goiter: Secondary | ICD-10-CM

## 2022-07-02 DIAGNOSIS — E063 Autoimmune thyroiditis: Secondary | ICD-10-CM | POA: Diagnosis not present

## 2022-07-09 ENCOUNTER — Ambulatory Visit
Admission: RE | Admit: 2022-07-09 | Discharge: 2022-07-09 | Disposition: A | Discharge status: Home / Self Care | Payer: 59 | Source: Ambulatory Visit | Attending: Internal Medicine | Admitting: Internal Medicine

## 2022-07-09 DIAGNOSIS — E042 Nontoxic multinodular goiter: Secondary | ICD-10-CM

## 2022-07-09 DIAGNOSIS — E049 Nontoxic goiter, unspecified: Secondary | ICD-10-CM | POA: Diagnosis not present

## 2022-08-11 DIAGNOSIS — L821 Other seborrheic keratosis: Secondary | ICD-10-CM | POA: Diagnosis not present

## 2022-08-11 DIAGNOSIS — B078 Other viral warts: Secondary | ICD-10-CM | POA: Diagnosis not present

## 2022-08-11 DIAGNOSIS — Z08 Encounter for follow-up examination after completed treatment for malignant neoplasm: Secondary | ICD-10-CM | POA: Diagnosis not present

## 2022-08-11 DIAGNOSIS — D23112 Other benign neoplasm of skin of right lower eyelid, including canthus: Secondary | ICD-10-CM | POA: Diagnosis not present

## 2022-08-11 DIAGNOSIS — D23122 Other benign neoplasm of skin of left lower eyelid, including canthus: Secondary | ICD-10-CM | POA: Diagnosis not present

## 2022-08-11 DIAGNOSIS — Z85828 Personal history of other malignant neoplasm of skin: Secondary | ICD-10-CM | POA: Diagnosis not present

## 2022-08-13 DIAGNOSIS — E039 Hypothyroidism, unspecified: Secondary | ICD-10-CM | POA: Diagnosis not present

## 2022-09-09 DIAGNOSIS — Z1231 Encounter for screening mammogram for malignant neoplasm of breast: Secondary | ICD-10-CM | POA: Diagnosis not present

## 2022-10-05 DIAGNOSIS — Z01419 Encounter for gynecological examination (general) (routine) without abnormal findings: Secondary | ICD-10-CM | POA: Diagnosis not present

## 2022-10-05 DIAGNOSIS — E039 Hypothyroidism, unspecified: Secondary | ICD-10-CM | POA: Diagnosis not present

## 2022-11-29 ENCOUNTER — Ambulatory Visit (HOSPITAL_BASED_OUTPATIENT_CLINIC_OR_DEPARTMENT_OTHER): Payer: 59 | Admitting: Internal Medicine

## 2022-11-29 ENCOUNTER — Encounter (HOSPITAL_BASED_OUTPATIENT_CLINIC_OR_DEPARTMENT_OTHER): Payer: Self-pay | Admitting: Internal Medicine

## 2022-11-29 VITALS — BP 130/84 | HR 86 | Ht 63.0 in | Wt 173.0 lb

## 2022-11-29 DIAGNOSIS — M791 Myalgia, unspecified site: Secondary | ICD-10-CM | POA: Diagnosis not present

## 2022-11-29 DIAGNOSIS — E785 Hyperlipidemia, unspecified: Secondary | ICD-10-CM | POA: Diagnosis not present

## 2022-11-29 DIAGNOSIS — I2510 Atherosclerotic heart disease of native coronary artery without angina pectoris: Secondary | ICD-10-CM

## 2022-11-29 DIAGNOSIS — T466X5D Adverse effect of antihyperlipidemic and antiarteriosclerotic drugs, subsequent encounter: Secondary | ICD-10-CM | POA: Diagnosis not present

## 2022-11-29 DIAGNOSIS — T466X5A Adverse effect of antihyperlipidemic and antiarteriosclerotic drugs, initial encounter: Secondary | ICD-10-CM

## 2022-11-29 MED ORDER — REPATHA SURECLICK 140 MG/ML ~~LOC~~ SOAJ: 140.0000 mg | SUBCUTANEOUS | 0 refills | Status: DC

## 2022-11-29 NOTE — Progress Notes (Signed)
LIPID CLINIC CONSULT NOTE  Chief Complaint:  Manage dyslipidemia  Primary Care Physician: Merri Brunette, MD  Primary Cardiologist:  None  HPI:  [name] is a 62 y.o. female who is being seen today for the evaluation of dyslipidemia at the request of Merri Brunette, MD. this is a 62 year old female referred for evaluation management of dyslipidemia.  She has a strong family history of coronary artery disease and has had episodes of chest pain in the past.  She has undergone 2 prior cardiac catheterizations, I believe in 2011 and again in 2013.  Neither of which showed any obstructive coronary disease.  She was seen by Dr. Odis Hollingshead in 2021 at which time she had stress testing and a monitor.  Ultimately, there were no clear findings from this study and it was noted that her stress test was negative with normal LV function.  More recently she had coronary calcium scoring.  This showed a calcium score of 50.1, 84th percentile for age and sex matched controls.  Based on these findings she was referred for lipid evaluation.  She has previously tried a number of statin medications but was intolerant to them.  She had tried Zetia before and was intolerant to that.  She tells me that she was on Praluent recently but had side effects on that including flulike symptoms.  She also thought that the statins caused flulike symptoms.  She had previously tried Repatha and was not sure that she had side effects with that but cost was an issue when her insurance changed.  PMHx:  Past Medical History:  Diagnosis Date   Family history of premature coronary artery disease    Hyperlipidemia    Hypertension    Thyroid disease     Past Surgical History:  Procedure Laterality Date   ABDOMINAL HYSTERECTOMY     APPENDECTOMY     CARDIAC CATHETERIZATION     CHOLECYSTECTOMY     LEFT HEART CATHETERIZATION WITH CORONARY ANGIOGRAM N/A 12/03/2011   Procedure: LEFT HEART CATHETERIZATION WITH CORONARY ANGIOGRAM;   Surgeon: Thurmon Fair, MD;  Location: [facility] CATH [facility];  Service: Cardiovascular;  Laterality: N/A;   THYROIDECTOMY      FAMHx:  Family History  Problem Relation Age of Onset   Heart disease Mother    Heart attack Mother    Hypertension Mother    Aortic aneurysm Mother    Aortic aneurysm Father    Heart disease Father    Stroke Father    Lupus Sister    Hyperlipidemia Brother    Crohn's disease Sister     SOCHx:   reports that she has never smoked. She has never used smokeless tobacco. She reports current alcohol use. She reports that she does not use drugs.  ALLERGIES:  Allergies  Allergen Reactions   Amoxicillin     Yeast infection   Sulfa Antibiotics Rash    ROS: Pertinent items noted in HPI and remainder of comprehensive ROS otherwise negative.  HOME MEDS: Current Outpatient Medications on File Prior to Visit  Medication Sig Dispense Refill   ALPRAZolam (XANAX) 0.5 MG tablet Take 0.5 mg by mouth at bedtime as needed. For anxiety/sleep     Cholecalciferol (KP VITAMIN D3) 50 MCG (2000 UT) CAPS Take by mouth.     famotidine (PEPCID) 20 MG tablet Take 40 mg by mouth at bedtime.      levothyroxine (SYNTHROID) 150 MCG tablet Take 137 tablets by mouth daily before breakfast.     losartan-hydrochlorothiazide (HYZAAR)  100-25 MG tablet Take 0.5-1 tablets by mouth daily.     MAGNESIUM PO Take by mouth.     No current facility-administered medications on file prior to visit.    LABS/IMAGING: No results found for this or any previous visit (from the past 48 hour(s)). No results found.  LIPID PANEL:    Component Value Date/Time   CHOL (H) 09/20/2006 1420    204        ATP III CLASSIFICATION:  <200     mg/dL   Desirable  062-694  mg/dL   Borderline High  >=854    mg/dL   High   TRIG 87 62/70/3500 1420   HDL 65 09/20/2006 1420   CHOLHDL 3.1 09/20/2006 1420   VLDL 17 09/20/2006 1420   LDLCALC (H) 09/20/2006 1420    122        Total Cholesterol/HDL:CHD Risk Coronary  Heart Disease Risk Table                     Men   Women  1/2 Average Risk   3.4   3.3    WEIGHTS: Wt Readings from Last 3 Encounters:  11/29/22 173 lb (78.5 kg)  04/03/21 169 lb (76.7 kg)  10/25/19 172 lb (78 kg)    VITALS: BP 130/84 (BP Location: Left Arm, Patient Position: Sitting, Cuff Size: Large)   Pulse 86   Ht 5\' 3"  (1.6 m)   Wt 173 lb (78.5 kg)   SpO2 95%   BMI 30.65 kg/m   EXAM: Deferred  EKG: Deferred  ASSESSMENT: Mixed dyslipidemia, goal LDL 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   [name] has a mixed dyslipidemia and remains above a target LDL less than 70.  She was noted recently to have an elevated calcium score which was 84th percentile.  There is a strong family history of early onset heart disease.  She has previously been on statins, Zetia and Praluent, all of which caused intolerances.  She may not have had side effects on Repatha but was switched due to insurance reasons.  We discussed options including Repatha although its mechanism is very similar to Praluent.  Additionally we could consider Leqvio or Nexletol.  For now, she wanted to try the Repatha.  Will reach out for prior authorization for this.  Plan repeat lipids and follow-up in about 3 to 4 months.  She did tell me that she had tried to enroll in an LP(a) trial but screened negative for genetic testing for this.  Thanks again for the kind referral.  Chrystie Nose, MD, [facility]  [place]  [facility]  Medical Director of the [facility]  Cardiovascular Risk Reduction Clinic Diplomate of the American Board of Clinical Lipidology Attending Cardiologist  Direct Dial: [phone]  Fax: [phone]  Website:  www..Blenda Nicely Simuel Stebner 11/29/2022, 4:59 PM

## 2022-11-29 NOTE — Patient Instructions (Addendum)
Medication Instructions:  Dr. Rennis Golden recommends Repatha Sureclick 140mg /ml (PCSK9). This is an injectable cholesterol medication self-administered once every 14 days. This medication will likely need prior approval with your insurance company, which we will work on. If the medication is not approved initially, we may need to do an appeal with your insurance.   Sample provided 11/29/22. Please contact office with how you are doing with the sample.  Administer medication in area of fatty tissue such as abdomen, outer thigh, back of upper arm - and rotate site with each injection Store medication in refrigerator until ready to administer - allow to sit at room temp for 30 mins - 1 hour prior to injection Dispose of medication in a SHARPS container - your pharmacy should be able to direct you on this and proper disposal   If you need a co-pay card for Repatha: Lawsponsor.fr If you need a co-pay card for Praluent: [email or web address]  Patient Assistance:    These foundations have funds at various times.   The [employer] Foundation: https://www.panfoundation.org/disease-funds/hypercholesterolemia/ -- can sign up for wait list  The [facility] offers assistance to help pay for medication copays.  They will cover copays for all cholesterol lowering meds, including statins, fibrates, omega-3 fish oils like Vascepa, ezetimibe, Repatha, Praluent, Nexletol, Nexlizet.  The cards are usually good for $2,500 or 12 months, whichever comes first. Our fax # is [phone] (you will need this to apply) Go to healthwellfoundation.org Click on "Apply Now" Answer questions as to whom is applying (patient or representative) Your disease fund will be "hypercholesterolemia - Medicare access" They will ask questions about finances and which medications you are taking for cholesterol When you submit, the approval is usually within minutes.  You will need to print the  card information from the site You will need to show this information to your pharmacy, they will bill your Medicare Part D plan first -then bill Health Well --for the copay.   You can also call them at [phone], although the hold times can be quite long.     *If you need a refill on your cardiac medications before your next appointment, please call your pharmacy*   Lab Work: FASTING lab work to check cholesterol in 3-4 months   Follow-Up: At [facility], you and your health needs are our priority.  As part of our continuing mission to provide you with exceptional heart care, we have created designated Provider Care Teams.  These Care Teams include your primary Cardiologist (physician) and Advanced Practice Providers (APPs -  Physician Assistants and Nurse Practitioners) who all work together to provide you with the care you need, when you need it.  We recommend signing up for the patient portal called "MyChart".  Sign up information is provided on this After Visit Summary.  MyChart is used to connect with patients for Virtual Visits (Telemedicine).  Patients are able to view lab/test results, encounter notes, upcoming appointments, etc.  Non-urgent messages can be sent to your provider as well.   To learn more about what you can do with MyChart, go to [email or web address].    Your next appointment:    3-4 months with Dr. Rennis Golden -- lipid clinic

## 2022-11-30 ENCOUNTER — Telehealth: Payer: Self-pay | Admitting: Pharmacy Technician

## 2022-11-30 ENCOUNTER — Other Ambulatory Visit (HOSPITAL_COMMUNITY): Payer: Self-pay

## 2022-11-30 NOTE — Telephone Encounter (Signed)
Pharmacy Patient Advocate Encounter   Received notification from  staff messages  that prior authorization for repatha is required/requested.   Insurance verification completed.   The patient is insured through [facility] .   Per test claim: PA required; PA submitted to [employer] [facility] via CoverMyMeds Key/confirmation #/EOC [facility] Status is pending

## 2022-11-30 NOTE — Telephone Encounter (Signed)
-----   Message from Nurse Eileen Stanford E sent at 11/29/2022  4:26 PM EDT ----- Regarding: repatha sureclick PA Hey team  Patient needs PA for Repatha Sureclick   Side effects on crestor and praluent - fatigue, flu-like symptoms  Dyslipidemia, LDL goal less than 70 Coronary artery calcifications

## 2022-12-01 ENCOUNTER — Other Ambulatory Visit (HOSPITAL_COMMUNITY): Payer: Self-pay

## 2022-12-01 NOTE — Telephone Encounter (Signed)
Pharmacy Patient Advocate Encounter  Received notification from [employer] that Prior Authorization for repatha has been APPROVED from 12/01/22 to 11/30/23. Ran test claim, Copay is $24.99 one month. This test claim was processed through [facility]- copay amounts may vary at other pharmacies due to pharmacy/plan contracts, or as the patient moves through the different stages of their insurance plan.   PA #/Case ID/Reference #: [id number]

## 2022-12-01 NOTE — Telephone Encounter (Signed)
Update on med approval sent to patient in MyChart

## 2022-12-03 DIAGNOSIS — E039 Hypothyroidism, unspecified: Secondary | ICD-10-CM | POA: Diagnosis not present

## 2022-12-06 NOTE — Telephone Encounter (Signed)
Per patient message, she wishes to hold off on Repatha at this time until thyroid levels are normalized/adjusted

## 2022-12-13 DIAGNOSIS — J029 Acute pharyngitis, unspecified: Secondary | ICD-10-CM | POA: Diagnosis not present

## 2022-12-13 DIAGNOSIS — J0100 Acute maxillary sinusitis, unspecified: Secondary | ICD-10-CM | POA: Diagnosis not present

## 2022-12-13 DIAGNOSIS — Z6830 Body mass index (BMI) 30.0-30.9, adult: Secondary | ICD-10-CM | POA: Diagnosis not present

## 2023-01-25 DIAGNOSIS — E039 Hypothyroidism, unspecified: Secondary | ICD-10-CM | POA: Diagnosis not present

## 2023-03-14 DIAGNOSIS — J00 Acute nasopharyngitis [common cold]: Secondary | ICD-10-CM | POA: Diagnosis not present

## 2023-03-14 DIAGNOSIS — M791 Myalgia, unspecified site: Secondary | ICD-10-CM | POA: Diagnosis not present

## 2023-03-14 DIAGNOSIS — Z6829 Body mass index (BMI) 29.0-29.9, adult: Secondary | ICD-10-CM | POA: Diagnosis not present

## 2023-04-20 ENCOUNTER — Encounter (HOSPITAL_BASED_OUTPATIENT_CLINIC_OR_DEPARTMENT_OTHER): Payer: 59 | Admitting: Internal Medicine

## 2023-05-09 DIAGNOSIS — E039 Hypothyroidism, unspecified: Secondary | ICD-10-CM | POA: Diagnosis not present

## 2023-05-10 DIAGNOSIS — Z8249 Family history of ischemic heart disease and other diseases of the circulatory system: Secondary | ICD-10-CM | POA: Diagnosis not present

## 2023-05-10 DIAGNOSIS — R002 Palpitations: Secondary | ICD-10-CM | POA: Diagnosis not present

## 2023-05-10 DIAGNOSIS — E039 Hypothyroidism, unspecified: Secondary | ICD-10-CM | POA: Diagnosis not present

## 2023-05-25 ENCOUNTER — Telehealth: Payer: Self-pay | Admitting: Internal Medicine

## 2023-05-25 NOTE — Telephone Encounter (Signed)
 Spoke with patient of Dr. Maximo Spar (lipid clinic). She has had palpations for 3 weeks. Palpitations occur without triggers, they make her cough, and give her an "uncomfortable feeling in her chest." She drinks 1/2c coffee in AM.  A few weeks ago endocrinology adjusted her thyroid medication. She states she is now taking levothyroxine but at half the dose she has taken in the past. From Dr. Antoniette Klein last note: Decrease Synthroid Tablet, 112 MCG, Orally, 1 tablet six days a week and no tablet one day a week, Once a day. Continue Cytomel Tablet, 5 MCG, Orally, 1 tablet on an empty stomach, Once a day  ** it is unclear what patient is taking exactly as she mentioned several thyroid meds and that she got recommendations from both Dr. Kathyanne Parkers and PCP   She has an AppleWatch - no signs of AFib  She reports extensive family history of heart disease. This is what she thought she was referred for when she saw Dr. Maximo Spar 11/2022. This referral was for lipid clinic ONLY per chart review. She was prescribed Repatha at that time, due to unclear reported SE when she previously tried it and d/t other intolerances. She later messaged in to MD that she would not be taking this med. She was displeased that her non-lipid concerns were not address at this visit.   On 05/10/23, she saw PCP for palps. He put in a referral for general cardiology. She said he did an EKG and suggested a heart monitor. She said she was told that a monitor may not be approved with insurance, thus referral to cards was ordered. No labs were done at this visit.   Advised she contact either Dr. Kathyanne Parkers or Dr. Schuyler Custard about her persistent palpitations, given her recent thyroid med adjustments, of which we cannot see all the notes regarding.   Advised we can schedule her with PA or NP and then she can establish care with a different cardiologist - not Dr. Albert Huff or Dr. Maximo Spar, per preference.   Scheduled for 05/30/23 @ 1:55pm with Tacey Exon NP  Total time: 22  minutes

## 2023-05-25 NOTE — Telephone Encounter (Signed)
 Patient c/o Palpitations: STAT if patient c/o lightheadedness, shortness of breath, or chest pain  How long have you had palpitations/irregular HR/ Afib? Are you having the symptoms now? Starting having them 3 weeks ago   Are you currently experiencing lightheadedness, SOB or CP? Taking her breath away when she feels it.   Do you have a history of afib (atrial fibrillation) or irregular heart rhythm? No  Have you checked your BP or HR? (document readings if available): No  Are you experiencing any other symptoms? No.

## 2023-05-26 NOTE — Telephone Encounter (Signed)
 Will address at clinic visit 05/30/2023.  Dorean Hiebert S Tyson Masin, NP

## 2023-05-30 ENCOUNTER — Encounter (HOSPITAL_BASED_OUTPATIENT_CLINIC_OR_DEPARTMENT_OTHER): Payer: Self-pay | Admitting: Family

## 2023-05-30 ENCOUNTER — Other Ambulatory Visit (INDEPENDENT_AMBULATORY_CARE_PROVIDER_SITE_OTHER)

## 2023-05-30 ENCOUNTER — Ambulatory Visit (HOSPITAL_BASED_OUTPATIENT_CLINIC_OR_DEPARTMENT_OTHER): Admitting: Family

## 2023-05-30 VITALS — BP 120/70 | HR 67 | Ht 63.0 in | Wt 173.0 lb

## 2023-05-30 DIAGNOSIS — R002 Palpitations: Secondary | ICD-10-CM

## 2023-05-30 DIAGNOSIS — I10 Essential (primary) hypertension: Secondary | ICD-10-CM

## 2023-05-30 DIAGNOSIS — I2510 Atherosclerotic heart disease of native coronary artery without angina pectoris: Secondary | ICD-10-CM

## 2023-05-30 DIAGNOSIS — E785 Hyperlipidemia, unspecified: Secondary | ICD-10-CM | POA: Diagnosis not present

## 2023-05-30 MED ORDER — METOPROLOL SUCCINATE ER 25 MG PO TB24: 25.0000 mg | ORAL_TABLET | Freq: Every day | ORAL | 2 refills | Status: DC

## 2023-05-30 NOTE — Patient Instructions (Signed)
 Medication Instructions:   START Metoprolol  Succinate 25mg  once per day Okay to take once per day in the morning or evening, whichever is easier for you May take with or without food  *If you need a refill on your cardiac medications before your next appointment, please call your pharmacy*  Lab Work: Your physician recommends that you return for lab work today: BMET, magnesium, CBC  If you have labs (blood work) drawn today and your tests are completely normal, you will receive your results only by: MyChart Message (if you have MyChart) OR A paper copy in the mail If you have any lab test that is abnormal or we need to change your treatment, we will call you to review the results.  Testing/Procedures: Your physician has recommended that you wear a Zio monitor.   This monitor is a medical device that records the heart's electrical activity. Doctors most often use these monitors to diagnose arrhythmias. Arrhythmias are problems with the speed or rhythm of the heartbeat. The monitor is a small device applied to your chest. You can wear one while you do your normal daily activities. While wearing this monitor if you have any symptoms to push the button and record what you felt. Once you have worn this monitor for the period of time provider prescribed (Usually 14 days), you will return the monitor device in the postage paid box. Once it is returned they will download the data collected and provide us  with a report which the provider will then review and we will call you with those results. Important tips:  Avoid showering during the first 24 hours of wearing the monitor. Avoid excessive sweating to help maximize wear time. Do not submerge the device, no hot tubs, and no swimming pools. Keep any lotions or oils away from the patch. After 24 hours you may shower with the patch on. Take brief showers with your back facing the shower head.  Do not remove patch once it has been placed because that  will interrupt data and decrease adhesive wear time. Push the button when you have any symptoms and write down what you were feeling. Once you have completed wearing your monitor, remove and place into box which has postage paid and place in your outgoing mailbox.  If for some reason you have misplaced your box then call our office and we can provide another box and/or mail it off for you.  Follow-Up: At [facility], you and your health needs are our priority.  As part of our continuing mission to provide you with exceptional heart care, our providers are all part of one team.  This team includes your primary Cardiologist (physician) and Advanced Practice Providers or APPs (Physician Assistants and Nurse Practitioners) who all work together to provide you with the care you need, when you need it.  Your next appointment:   2 month(s)  Provider:   Neomi Banks, NP    We recommend signing up for the patient portal called "MyChart".  Sign up information is provided on this After Visit Summary.  MyChart is used to connect with patients for Virtual Visits (Telemedicine).  Patients are able to view lab/test results, encounter notes, upcoming appointments, etc.  Non-urgent messages can be sent to your provider as well.   To learn more about what you can do with MyChart, go to [email or web address].   Other Instructions  To prevent palpitations: Make sure you are adequately hydrated.  Avoid and/or limit caffeine containing beverages like soda or  tea. Exercise regularly.  Manage stress well. Some over the counter medications can cause palpitations such as Benadryl, AdvilPM, TylenolPM. Regular Advil or Tylenol  do not cause palpitations.   ________________________  For coronary artery disease often called "heart disease" we aim for optimal guideline directed medical therapy. We use the "A, B, C"s to help keep us  on track!  A = Aspirin 81mg  daily B = Blood pressure control to goal less  than 70. C = Cholesterol control.  D = Diet - aiming for low sodium, heart healthy diet E = Exercise - aiming for 150 minutes of moderate intensity activity per week

## 2023-05-30 NOTE — Progress Notes (Signed)
 Cardiology Office Note:  .   Date:  05/30/2023  ID:  Reeves Canter, DOB [birth year], MRN [id number] PCP: Imelda Man, MD  [facility] Providers Cardiologist:  None Cardiology APP:  Clearnce Curia, NP    History of Present Illness: .   [name] is a 63 y.o. female with history of hyperlipidemia, hypothyroidism, hypertension.  Family history of coronary artery disease.    Prior catheterization 2011 and 2013 with no obstructive coronary disease.  Seen by Dr. Albert Huff 01/2020. Stress testing no ischemia and normal LVEF. 7-day monitor predominantly normal sinus rhythm average heart rate 75 bpm with no significant arrhythmias and triggered events associate with sinus rhythm or sinus tachycardia. Echo 09/27/2019 normal LVEF 56%, mild concentric LVH, normal diastolic function, trace MR, mild TR..  03/15/2022 calcium score of 50.1 placing her in the 84th percentile for age, race, sex matched controls with the entirety of the calcification being in her LAD.  Established with lipid clinic Dr. Maximo Spar 11/29/22 and was recommended re-trial of Repatha  however, elected not to proceed. Prior intolerance to Zetia, statin, Praluent .   She contacted the office 05/25/2023 noting 3-week history of palpitations.  Of note levothyroxine dose had recently been adjusted.  She was limited to half cup of coffee in the morning.  Presents today. Dr. Kathyanne Parkers has switched NP Thyroid  to Levothyroxine - she is concerned about weight gain related to this and sudden fluctuations in her thyroid  levels. Cytomel has been added. Notes she will be talking then the palpitations will suddenly take her breath. Most often when laying down at night but also noticeable during the daytime. She did take ac ouple doses of PRN Xanax before bed which did help palpitations. Limiting to less than 1 cup of coffee per day, avoids sodas, very minimal chocolate. No recent stressors. No over the counter pro arrhythmics. Yesterday was on her feet  most of the day and did notice some swelling. Otherwise no significant edema. Family history of atrial fibrillation in her mother which understandably concerns her regarding palpitations.   Lipid panel 06/14/2022 total cholesterol 241, HDL 72, LDL 144, triglycerides 142.  Previous lipid lowering agents: Zetia - intolerant Praluent  - flu like symptoms Statins - flu like symptoms Repatha  - flu like symptoms  ROS: Please see the history of present illness.    All other systems reviewed and are negative.   Studies Reviewed: Aaron Aas   EKG Interpretation Date/Time:  Monday May 30 2023 14:03:20 EDT Ventricular Rate:  67 PR Interval:  172 QRS Duration:  92 QT Interval:  430 QTC Calculation: 454 R Axis:   40  Text Interpretation: Normal sinus rhythm  No acute ST/T wave changes. Confirmed by Neomi Banks ([id number]) on 05/30/2023 2:05:52 PM    Cardiac Studies & Procedures   ______________________________________________________________________________________________   STRESS TESTS  PCV MYOCARDIAL PERFUSION WO LEXISCAN  10/31/2019  Narrative Exercise Sestamibi stress test 10/31/2019: 29Exercise nuclear stress test was performed using Bruce protocol. Patient reached 10.1 METS, and 87% of age predicted maximum heart rate. Exercise capacity was good. Chest pain not reported. Heart rate and hemodynamic response were normal. Stress EKG revealed no ischemic changes. Normal myocardial perfusion. Stress LVEF 77%. Low risk study.   ECHOCARDIOGRAM  PCV ECHOCARDIOGRAM COMPLETE 09/27/2019  Narrative Echocardiogram 09/27/2019: Left ventricle cavity is normal in size. Mild concentric hypertrophy of the left ventricle. Normal global wall motion. Normal LV systolic function with EF 56%. Normal diastolic filling pattern. Calculated EF 56%. Mild tricuspid regurgitation. Estimated pulmonary artery systolic  pressure 26 mmHg.    MONITORS  LONG TERM MONITOR (3-14 DAYS) 10/16/2019  Narrative 7 day extended  Holter monitor: Dominant rhythm normal sinus rhythm. Heart rate 50-145 bpm.  Average heart rate 75 bpm. No atrial fibrillation/supraventricular tachycardia/non-sustained ventricular tachycardia/high grade AV block, pause (3 seconds or longer). Total ventricular ectopic burden <1%. Total supraventricular ectopic burden <1%. Number of patient triggered events: 6.  The underlying rhythm was either normal sinus or sinus tachycardia with rare ventricular ectopy.  No significant dysrhythmias.   CT SCANS  CT CARDIAC SCORING (SELF PAY ONLY) 03/15/2022  Addendum 03/16/2022  2:49 PM ADDENDUM REPORT: 03/16/2022 14:47  EXAM: OVER-READ INTERPRETATION  CT CHEST  The following report is an over-read performed by radiologist Dr. Mccoy Spell [facility] on 03/16/2022. This over-read does not include interpretation of cardiac or coronary anatomy or pathology. The coronary calcium score interpretation by the cardiologist is attached.  COMPARISON:  None.  FINDINGS: Aorta normal caliber. No pericardial effusion. Esophagus normal appearance. No adenopathy. Visualized liver normal appearance. 2 mm nodule RIGHT middle lobe image 27. Lungs clear. Osseous structures unremarkable.  IMPRESSION: 2 mm RIGHT middle lobe nodule  Per Fleischner Society Guidelines, no routine follow-up imaging is recommended.  These guidelines do not apply to immunocompromised patients and patients with cancer. Follow up in patients with significant comorbidities as clinically warranted. For lung cancer screening, adhere to Lung-RADS guidelines. Reference: Radiology. 2017; 284(1):228-43.  No additional extracardiac abnormalities.   Electronically Signed By: Wynelle Heather M.D. On: 03/16/2022 14:47  Narrative CLINICAL DATA:  Cardiovascular Disease Risk stratification  EXAM: Coronary Calcium Score  TECHNIQUE: A gated, non-contrast computed tomography scan of the heart was performed using 3mm slice  thickness. Axial images were analyzed on a dedicated workstation. Calcium scoring of the coronary arteries was performed using the Agatston method.  FINDINGS: Coronary Calcium Score:  Left main: 0  Left anterior descending artery: 50.1  Left circumflex artery: 0  Right coronary artery: 0  Total: 50.1  Percentile: 84th  Pericardium: Normal.  Non-cardiac: See separate report from [facility].  IMPRESSION: 1. Coronary calcium score of 50.1. This was 84th percentile for age-, race-, and sex-matched controls.  RECOMMENDATIONS: Coronary artery calcium (CAC) score is a strong predictor of incident coronary heart disease (CHD) and provides predictive information beyond traditional risk factors. CAC scoring is reasonable to use in the decision to withhold, postpone, or initiate statin therapy in intermediate-risk or selected borderline-risk asymptomatic adults (age 95-75 years and LDL-C >=70 to <190 mg/dL) who do not have diabetes or established atherosclerotic cardiovascular disease (ASCVD).* In intermediate-risk (10-year ASCVD risk >=7.5% to <20%) adults or selected borderline-risk (10-year ASCVD risk >=5% to <7.5%) adults in whom a CAC score is measured for the purpose of making a treatment decision the following recommendations have been made:  If CAC=0, it is reasonable to withhold statin therapy and reassess in 5 to 10 years, as long as higher risk conditions are absent (diabetes mellitus, family history of premature CHD in first degree relatives (males <55 years; females <65 years), cigarette smoking, or LDL >=190 mg/dL).  If CAC is 1 to 99, it is reasonable to initiate statin therapy for patients >=81 years of age.  If CAC is >=100 or >=75th percentile, it is reasonable to initiate statin therapy at any age.  Cardiology referral should be considered for patients with CAC scores >=400 or >=75th percentile.  *2018  AHA/ACC/AACVPR/AAPA/ABC/ACPM/ADA/AGS/APhA/ASPC/NLA/PCNA Guideline on the Management of Blood Cholesterol: A Report of the [employer] of  Cardiology/American Heart Association Task Force on Clinical Practice Guidelines. J Am Coll Cardiol. 2019;73(24):3168-3209.  Jackquelyn Mass, MD  Electronically Signed: By: Jackquelyn Mass M.D. On: 03/15/2022 16:47     ______________________________________________________________________________________________      Risk Assessment/Calculations:             Physical Exam:   VS:  BP 120/70 (BP Location: Left Arm, Patient Position: Sitting, Cuff Size: Normal)   Pulse 67   Ht 5\' 3"  (1.6 m)   Wt 173 lb (78.5 kg)   BMI 30.65 kg/m    Wt Readings from Last 3 Encounters:  05/30/23 173 lb (78.5 kg)  11/29/22 173 lb (78.5 kg)  04/03/21 169 lb (76.7 kg)    GEN: Well nourished, well developed in no acute distress NECK: No JVD; No carotid bruits CARDIAC: RRR, no murmurs, rubs, gallops RESPIRATORY:  Clear to auscultation without rales, wheezing or rhonchi  ABDOMEN: Soft, non-tender, non-distended EXTREMITIES:  No edema; No deformity   ASSESSMENT AND PLAN: .    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 No indication for ischemic evaluation.  Previous intolerance to statin, Zetia, Repatha , Praluent . Hesitant about long acting Leqvio. Given information on Nexletol to review. She will contact us  if interested and can consider 2 week sample. Recommend aiming for 150 minutes of moderate intensity activity per week and following a heart healthy diet.    Palpitations - 1 month history of palpitations sensation of heart in her throat associated with some dyspnea. Labs today: BMET, magnesium, CBC today to assess for etiology of palpitations. 14 day ZIO placed in clinic. Rx Toprol  25mg  daily. Up-titrate based on symptoms or ZIO results.   Hypothyroidism - Planning to establish with new endocrinologist. Has upcoming appt with  PCP to discuss. Feels most of her symptoms onset with change from NP thyroid  to levothyroxine.   HTN - BP well controlled. Continue current antihypertensive regimen Losartan-hydrochlorothiazide 100-25mg  daily. Discussed to monitor BP at home at least 2 hours after medications and sitting for 5-10 minutes.         Dispo: follow up in 2 months with Clearnce Curia, NP   Signed, Clearnce Curia, NP

## 2023-05-31 LAB — CBC
Hematocrit: 39.6 % (ref 34.0–46.6)
Hemoglobin: 13.3 g/dL (ref 11.1–15.9)
MCH: 33.2 pg — ABNORMAL HIGH (ref 26.6–33.0)
MCHC: 33.6 g/dL (ref 31.5–35.7)
MCV: 99 fL — ABNORMAL HIGH (ref 79–97)
Platelets: 252 10*3/uL (ref 150–450)
RBC: 4.01 x10E6/uL (ref 3.77–5.28)
RDW: 12.1 % (ref 11.7–15.4)
WBC: 7.6 10*3/uL (ref 3.4–10.8)

## 2023-05-31 LAB — BASIC METABOLIC PANEL WITH GFR
BUN/Creatinine Ratio: 18 (ref 12–28)
BUN: 11 mg/dL (ref 8–27)
CO2: 25 mmol/L (ref 20–29)
Calcium: 10.1 mg/dL (ref 8.7–10.3)
Chloride: 100 mmol/L (ref 96–106)
Creatinine, Ser: 0.62 mg/dL (ref 0.57–1.00)
Glucose: 87 mg/dL (ref 70–99)
Potassium: 3.8 mmol/L (ref 3.5–5.2)
Sodium: 142 mmol/L (ref 134–144)
eGFR: 101 mL/min/{1.73_m2} (ref >59)

## 2023-05-31 LAB — MAGNESIUM: Magnesium: 2.2 mg/dL (ref 1.6–2.3)

## 2023-06-01 ENCOUNTER — Encounter (HOSPITAL_BASED_OUTPATIENT_CLINIC_OR_DEPARTMENT_OTHER): Payer: Self-pay

## 2023-06-07 DIAGNOSIS — I10 Essential (primary) hypertension: Secondary | ICD-10-CM | POA: Diagnosis not present

## 2023-06-07 DIAGNOSIS — R7303 Prediabetes: Secondary | ICD-10-CM | POA: Diagnosis not present

## 2023-06-07 DIAGNOSIS — Z0000 Encounter for general adult medical examination without abnormal findings: Secondary | ICD-10-CM | POA: Diagnosis not present

## 2023-06-09 DIAGNOSIS — E039 Hypothyroidism, unspecified: Secondary | ICD-10-CM | POA: Diagnosis not present

## 2023-06-09 DIAGNOSIS — E042 Nontoxic multinodular goiter: Secondary | ICD-10-CM | POA: Diagnosis not present

## 2023-06-09 DIAGNOSIS — Z0000 Encounter for general adult medical examination without abnormal findings: Secondary | ICD-10-CM | POA: Diagnosis not present

## 2023-06-09 DIAGNOSIS — I2510 Atherosclerotic heart disease of native coronary artery without angina pectoris: Secondary | ICD-10-CM | POA: Diagnosis not present

## 2023-06-09 DIAGNOSIS — E782 Mixed hyperlipidemia: Secondary | ICD-10-CM | POA: Diagnosis not present

## 2023-06-09 DIAGNOSIS — Z85828 Personal history of other malignant neoplasm of skin: Secondary | ICD-10-CM | POA: Diagnosis not present

## 2023-06-09 DIAGNOSIS — K219 Gastro-esophageal reflux disease without esophagitis: Secondary | ICD-10-CM | POA: Diagnosis not present

## 2023-06-09 DIAGNOSIS — I10 Essential (primary) hypertension: Secondary | ICD-10-CM | POA: Diagnosis not present

## 2023-06-09 DIAGNOSIS — R7303 Prediabetes: Secondary | ICD-10-CM | POA: Diagnosis not present

## 2023-06-23 DIAGNOSIS — E039 Hypothyroidism, unspecified: Secondary | ICD-10-CM | POA: Diagnosis not present

## 2023-06-28 NOTE — Telephone Encounter (Signed)
 Preliminary report of ZIO reviewed. It shows predominantly normal sinus rhythm with average heart rate 73 bpm. Triggered episodes associated with normal rhythm or occasional PVC. A PVC is an early beat in the bottom chamber of the heart. These are not dangerous and were only happening <1% of the time. If still bothered by palpitations, may increase Toprol  to 37.5mg  (1.5 tablets) once per day. If palpitations are overall not bothersome, continue Toprol  25mg  (1 tablet) daily. Otherwise continue to stay well hydrated, limit caffeine, manage stressors well. This is a reassuring result.   Haleem Hanner S Tayler Lassen, NP

## 2023-06-28 NOTE — Telephone Encounter (Signed)
**Note De-identified  [place] Obfuscation** Please advise 

## 2023-07-12 DIAGNOSIS — R002 Palpitations: Secondary | ICD-10-CM

## 2023-07-14 ENCOUNTER — Ambulatory Visit (HOSPITAL_BASED_OUTPATIENT_CLINIC_OR_DEPARTMENT_OTHER): Payer: Self-pay | Admitting: Family

## 2023-09-02 ENCOUNTER — Ambulatory Visit (HOSPITAL_BASED_OUTPATIENT_CLINIC_OR_DEPARTMENT_OTHER): Admitting: Family

## 2023-09-02 ENCOUNTER — Encounter (HOSPITAL_BASED_OUTPATIENT_CLINIC_OR_DEPARTMENT_OTHER): Payer: Self-pay | Admitting: Family

## 2023-09-02 VITALS — BP 136/80 | HR 70 | Ht 63.25 in | Wt 173.8 lb

## 2023-09-02 DIAGNOSIS — R002 Palpitations: Secondary | ICD-10-CM | POA: Diagnosis not present

## 2023-09-02 DIAGNOSIS — I10 Essential (primary) hypertension: Secondary | ICD-10-CM

## 2023-09-02 DIAGNOSIS — I2510 Atherosclerotic heart disease of native coronary artery without angina pectoris: Secondary | ICD-10-CM

## 2023-09-02 DIAGNOSIS — E785 Hyperlipidemia, unspecified: Secondary | ICD-10-CM

## 2023-09-02 MED ORDER — NEXLETOL 180 MG PO TABS
1.0000 | ORAL_TABLET | Freq: Every day | ORAL | Status: AC
Start: 2023-09-02 — End: ?

## 2023-09-02 MED ORDER — NEXLETOL 180 MG PO TABS
1.0000 | ORAL_TABLET | Freq: Every day | ORAL | 3 refills | Status: AC
Start: 2023-09-02 — End: ?

## 2023-09-02 MED ORDER — METOPROLOL SUCCINATE ER 25 MG PO TB24: 12.5000 mg | ORAL_TABLET | Freq: Every day | ORAL | 1 refills | Status: AC

## 2023-09-02 NOTE — Progress Notes (Signed)
 Cardiology Office Note:  .   Date:  09/02/2023  ID:  Reena LITTIE Finder, DOB 02-28-1960, MRN [id number] PCP: Clarice Nottingham, MD  [facility] Providers Cardiologist:  None Cardiology APP:  Finder Reche RAMAN, NP    History of Present Illness: .   [name] is a 63 y.o. female with history of hyperlipidemia, hypothyroidism, hypertension.  Family history of coronary artery disease.    Prior catheterization 2011 and 2013 with no obstructive coronary disease.  Seen by Dr. Michele 01/2020. Stress testing no ischemia and normal LVEF. 7-day monitor predominantly normal sinus rhythm average heart rate 75 bpm with no significant arrhythmias and triggered events associate with sinus rhythm or sinus tachycardia. Echo 09/27/2019 normal LVEF 56%, mild concentric LVH, normal diastolic function, trace MR, mild TR..  03/15/2022 calcium score of 50.1 placing her in the 84th percentile for age, race, sex matched controls with the entirety of the calcification being in her LAD.  Established with lipid clinic Dr. Mona 11/29/22 and was recommended re-trial of Repatha  however, elected not to proceed. Prior intolerance to Zetia, statin, Praluent .   She contacted the office 05/25/2023 noting 3-week history of palpitations.  Of note levothyroxine dose had recently been adjusted.  She was limited to half cup of coffee in the morning. Seen 05/30/23 with plaliptaitons. She had recent changes to thyroid  medications. BMET, CBC, mag unremarkable. Subsequent ZIO predominantly NSR average heart rate 73 bpm. Triggered episodes with NSR or occasional PVC which were occurring <1% of the time.   Presents today for follow up. Reports palpitations have stopped. Reviewed results of monitor and reassurance provided. Continues to have concerns regardng her thryoid but appointment with new endocrinologist not until [place]. Encouraged her to reach out to her existing endocrinologist regarding concerns. Notes some throat soreness, abdominal  pain which she attributes to eating too many tomatoes and subsequent acid reflux.    Previous lipid lowering agents: Zetia - intolerant Praluent  - flu like symptoms Statins - flu like symptoms Repatha  - flu like symptoms  ROS: Please see the history of present illness.    All other systems reviewed and are negative.   Studies Reviewed: .           Risk Assessment/Calculations:             Physical Exam:   VS:  BP 136/80   Pulse 70   Ht 5' 3.25 (1.607 m)   Wt 173 lb 12.8 oz (78.8 kg)   SpO2 98%   BMI 30.54 kg/m    Wt Readings from Last 3 Encounters:  09/02/23 173 lb 12.8 oz (78.8 kg)  05/30/23 173 lb (78.5 kg)  11/29/22 173 lb (78.5 kg)    GEN: Well nourished, well developed in no acute distress NECK: No JVD; No carotid bruits CARDIAC: RRR, no murmurs, rubs, gallops RESPIRATORY:  Clear to auscultation without rales, wheezing or rhonchi  ABDOMEN: Soft, non-tender, non-distended EXTREMITIES:  No edema; No deformity   ASSESSMENT AND PLAN: .    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 No indication for ischemic evaluation.  Previous intolerance to statin, Zetia, Repatha , Praluent . Hesitant about long acting Leqvio.Rx Nexletol. Coupon provided. FLP/ALT in 3 months. Recommend aiming for 150 minutes of moderate intensity activity per week and following a heart healthy diet.  06/03/23 totl cholesterol 279, HDL 59, LDL 191, triglycerides 158.   Palpitations - Resolved. ZIO monitor with no significant arrhythmias. Continue Metoprolol  Succinate 12.5mg  daily.  Hypothyroidism -  Planning to establish with new endocrinologist in October. Encouraged to follow up with existing endocrinology provider with thyroid  concerns.  HTN - BP initially elevated but improved on repeat. Continue current antihypertensive regimen Losartan-hydrochlorothiazide 100-25mg  daily. Discussed to monitor BP at home at least 2 hours after medications and sitting for 5-10 minutes.  Continue to follow with PCP.         Dispo: follow up in 6 months  Signed, Reche GORMAN Finder, NP

## 2023-09-02 NOTE — Patient Instructions (Signed)
 Medication Instructions:   CHANGE Toprol  XL one half (0.5) tablet by mouth ( 12.5 mg) daily.   START Nexletol one (1) tablet by mouth ( 180 mg) daily.   *If you need a refill on your cardiac medications before your next appointment, please call your pharmacy*  Lab Work:  Your physician recommends that you return for a FASTING lipid profile/alt, fasting after midnight.  3 months in October. Paper work given to patient today.    If you have labs (blood work) drawn today and your tests are completely normal, you will receive your results only by: MyChart Message (if you have MyChart) OR A paper copy in the mail If you have any lab test that is abnormal or we need to change your treatment, we will call you to review the results.  Testing/Procedures:  None ordered.  Follow-Up: At [facility], you and your health needs are our priority.  As part of our continuing mission to provide you with exceptional heart care, our providers are all part of one team.  This team includes your primary Cardiologist (physician) and Advanced Practice Providers or APPs (Physician Assistants and Nurse Practitioners) who all work together to provide you with the care you need, when you need it.  Your next appointment:   6 month(s)  Provider:   Reche Finder, NP    We recommend signing up for the patient portal called MyChart.  Sign up information is provided on this After Visit Summary.  MyChart is used to connect with patients for Virtual Visits (Telemedicine).  Patients are able to view lab/test results, encounter notes, upcoming appointments, etc.  Non-urgent messages can be sent to your provider as well.   To learn more about what you can do with MyChart, go to [email or web address].   Other Instructions  Your physician wants you to follow-up in: 6 months.  You will receive a reminder letter in the mail two months in advance. If you don't receive a letter, please call our office to  schedule the follow-up appointment.

## 2023-09-15 DIAGNOSIS — Z1231 Encounter for screening mammogram for malignant neoplasm of breast: Secondary | ICD-10-CM | POA: Diagnosis not present

## 2023-10-05 DIAGNOSIS — R002 Palpitations: Secondary | ICD-10-CM | POA: Diagnosis not present

## 2023-10-05 DIAGNOSIS — I2510 Atherosclerotic heart disease of native coronary artery without angina pectoris: Secondary | ICD-10-CM | POA: Diagnosis not present

## 2023-10-05 DIAGNOSIS — E039 Hypothyroidism, unspecified: Secondary | ICD-10-CM | POA: Diagnosis not present

## 2023-10-05 DIAGNOSIS — E785 Hyperlipidemia, unspecified: Secondary | ICD-10-CM | POA: Diagnosis not present

## 2023-10-05 DIAGNOSIS — I10 Essential (primary) hypertension: Secondary | ICD-10-CM | POA: Diagnosis not present

## 2023-10-06 ENCOUNTER — Ambulatory Visit (HOSPITAL_BASED_OUTPATIENT_CLINIC_OR_DEPARTMENT_OTHER): Payer: Self-pay | Admitting: Family

## 2023-10-06 ENCOUNTER — Encounter (HOSPITAL_BASED_OUTPATIENT_CLINIC_OR_DEPARTMENT_OTHER): Payer: Self-pay

## 2023-10-06 DIAGNOSIS — E7849 Other hyperlipidemia: Secondary | ICD-10-CM

## 2023-10-06 LAB — LIPID PANEL
Chol/HDL Ratio: 5.3 ratio — ABNORMAL HIGH (ref 0.0–4.4)
Cholesterol, Total: 297 mg/dL — ABNORMAL HIGH (ref 100–199)
HDL: 56 mg/dL (ref >39)
LDL Chol Calc (NIH): 198 mg/dL — ABNORMAL HIGH (ref 0–99)
Triglycerides: 223 mg/dL — ABNORMAL HIGH (ref 0–149)
VLDL Cholesterol Cal: 43 mg/dL — ABNORMAL HIGH (ref 5–40)

## 2023-10-06 LAB — ALT: ALT: 32 IU/L (ref 0–32)

## 2023-10-12 DIAGNOSIS — E039 Hypothyroidism, unspecified: Secondary | ICD-10-CM | POA: Diagnosis not present

## 2023-11-01 ENCOUNTER — Telehealth: Payer: Self-pay | Admitting: Pharmacy Technician

## 2023-11-01 NOTE — Telephone Encounter (Signed)
  But per note:

## 2023-11-06 DIAGNOSIS — S92401B Displaced unspecified fracture of right great toe, initial encounter for open fracture: Secondary | ICD-10-CM | POA: Diagnosis not present

## 2023-11-30 DIAGNOSIS — M79674 Pain in right toe(s): Secondary | ICD-10-CM | POA: Diagnosis not present

## 2023-12-01 DIAGNOSIS — E890 Postprocedural hypothyroidism: Secondary | ICD-10-CM | POA: Diagnosis not present

## 2023-12-16 DIAGNOSIS — S92421D Displaced fracture of distal phalanx of right great toe, subsequent encounter for fracture with routine healing: Secondary | ICD-10-CM | POA: Diagnosis not present

## 2024-01-03 DIAGNOSIS — Z6829 Body mass index (BMI) 29.0-29.9, adult: Secondary | ICD-10-CM | POA: Diagnosis not present

## 2024-01-03 DIAGNOSIS — Z1152 Encounter for screening for COVID-19: Secondary | ICD-10-CM | POA: Diagnosis not present

## 2024-01-03 DIAGNOSIS — M791 Myalgia, unspecified site: Secondary | ICD-10-CM | POA: Diagnosis not present

## 2024-01-03 DIAGNOSIS — Z20828 Contact with and (suspected) exposure to other viral communicable diseases: Secondary | ICD-10-CM | POA: Diagnosis not present

## 2024-01-03 DIAGNOSIS — R509 Fever, unspecified: Secondary | ICD-10-CM | POA: Diagnosis not present

## 2024-01-03 DIAGNOSIS — J029 Acute pharyngitis, unspecified: Secondary | ICD-10-CM | POA: Diagnosis not present

## 2024-01-04 IMAGING — US US EXTREM LOW VENOUS*L*
1 series · 14 of 24 positions shown · non-contrast
Comparison: CT AP, 12/27/2013.

CLINICAL DATA: LEFT calf pain x2 days.

EXAM:
LEFT LOWER EXTREMITY VENOUS DOPPLER ULTRASOUND
TECHNIQUE: Gray-scale sonography with compression, as well as color and duplex
ultrasound, were performed to evaluate the deep venous system(s)
from the level of the common femoral vein through the popliteal and
proximal calf veins.

[Series 1: us venous img lower uni left (dvt) · portal-venous · 14 of 39 slices shown]
[im 1/39]
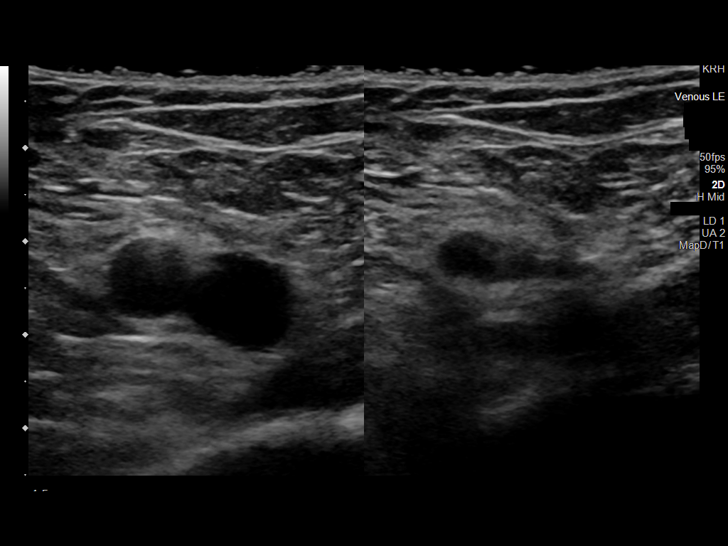
[im 4/39]
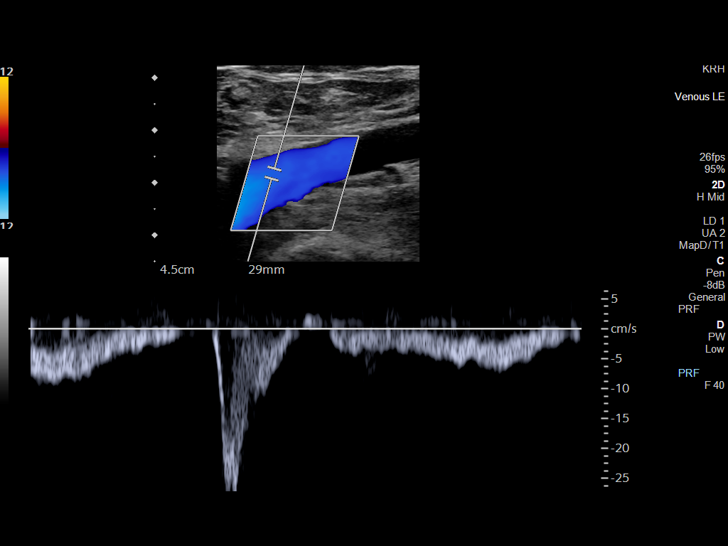
[im 7/39]
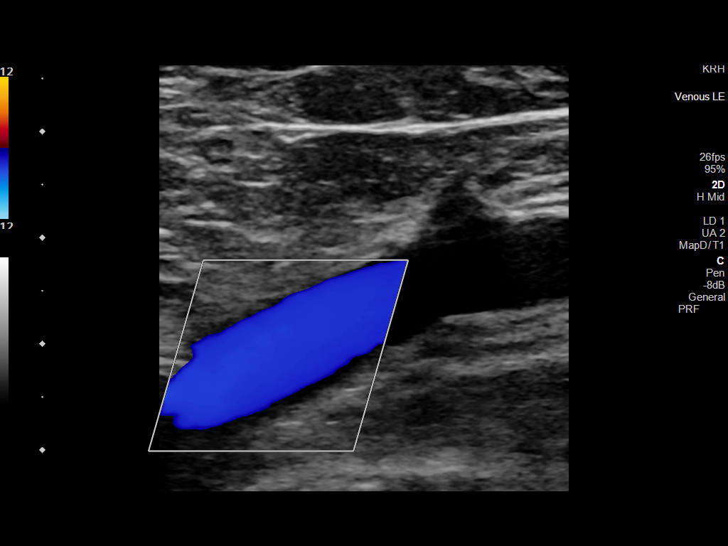
[im 10/39]
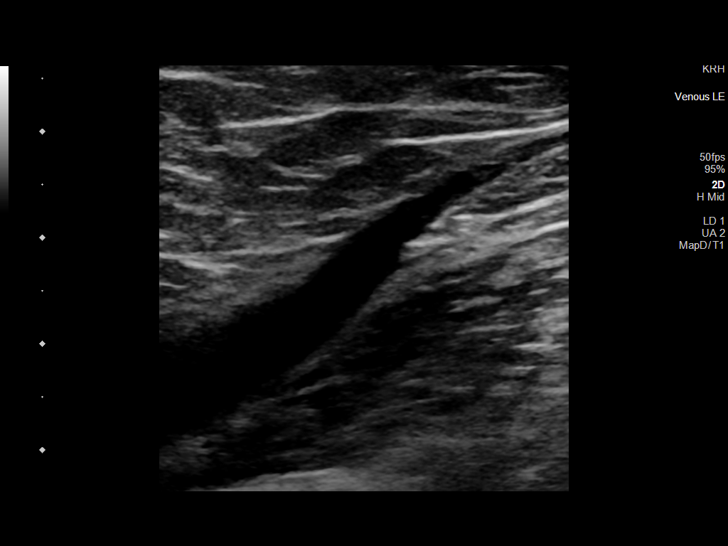
[im 12/39]
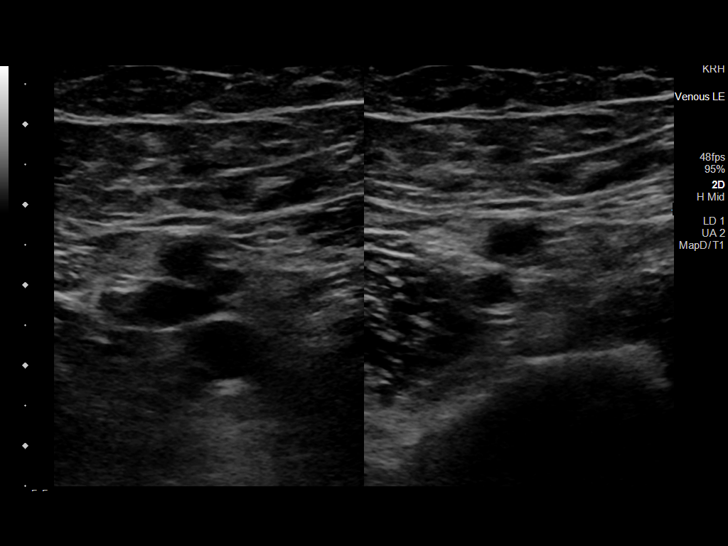
[im 15/39]
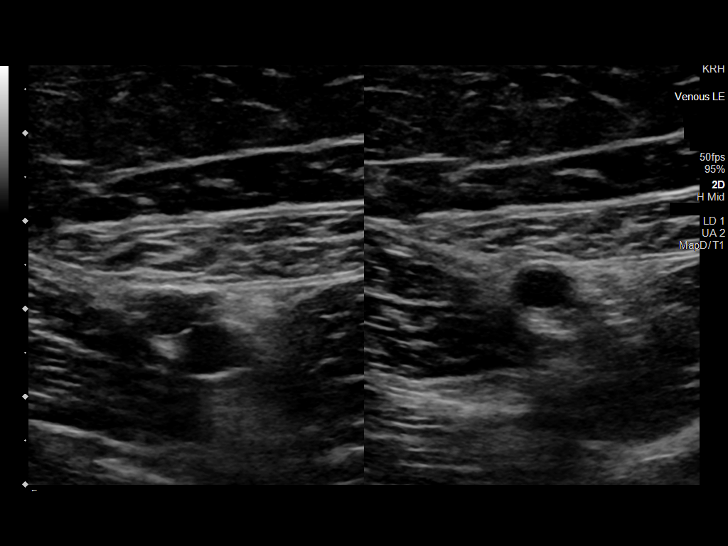
[im 19/39]
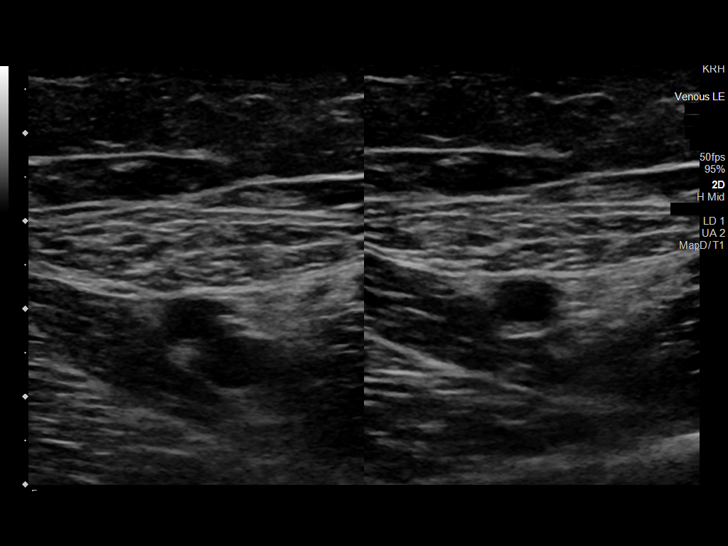
[im 20/39]
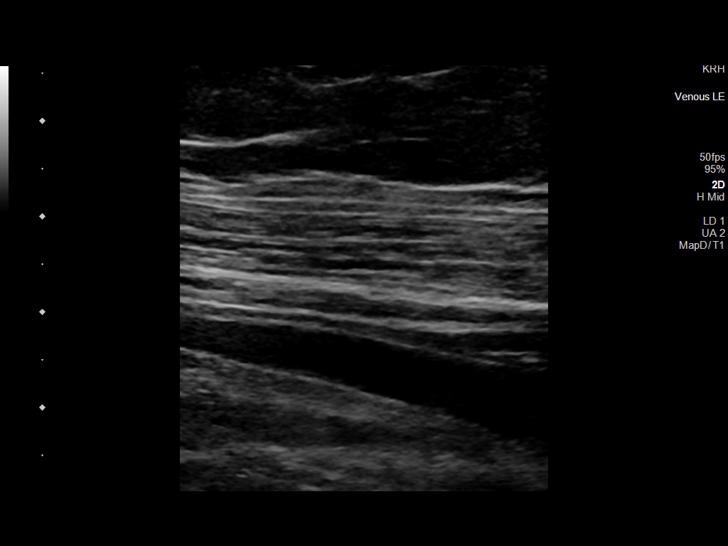
[im 24/39]
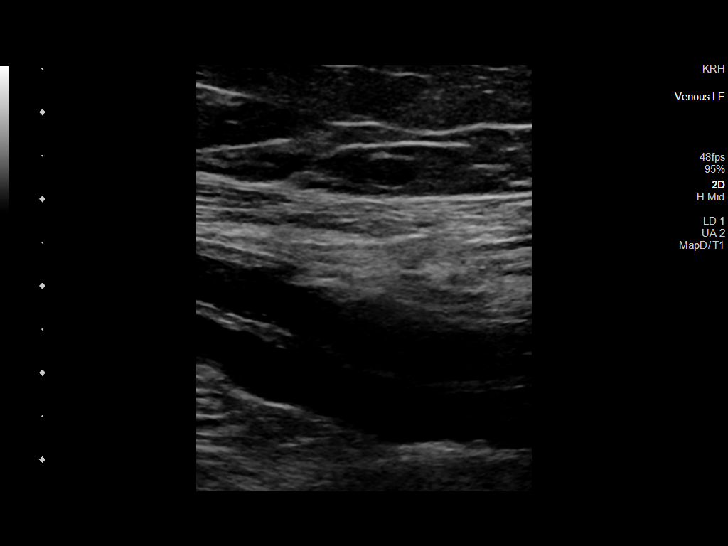
[im 27/39]
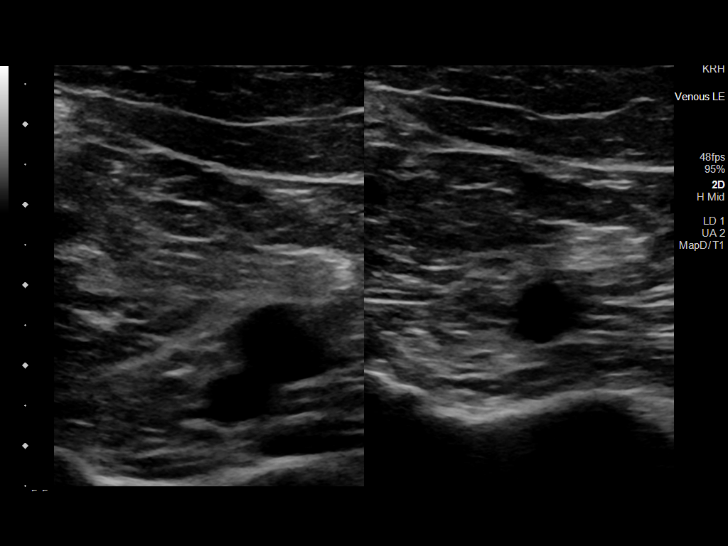
[im 30/39]
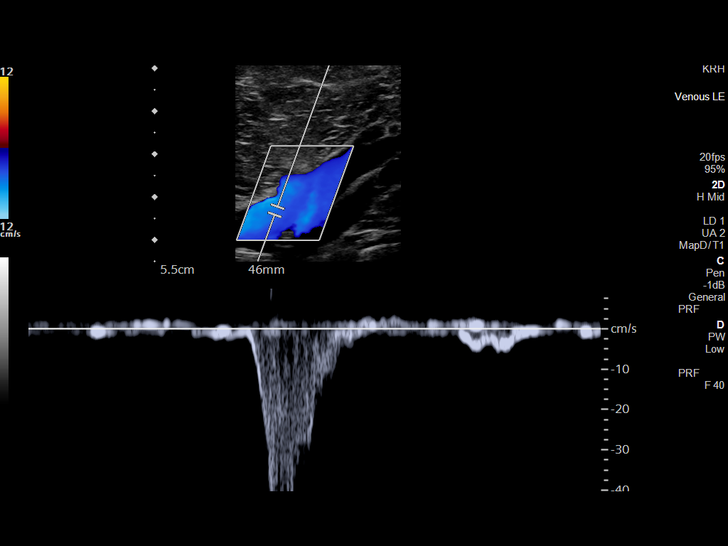
[im 32/39]
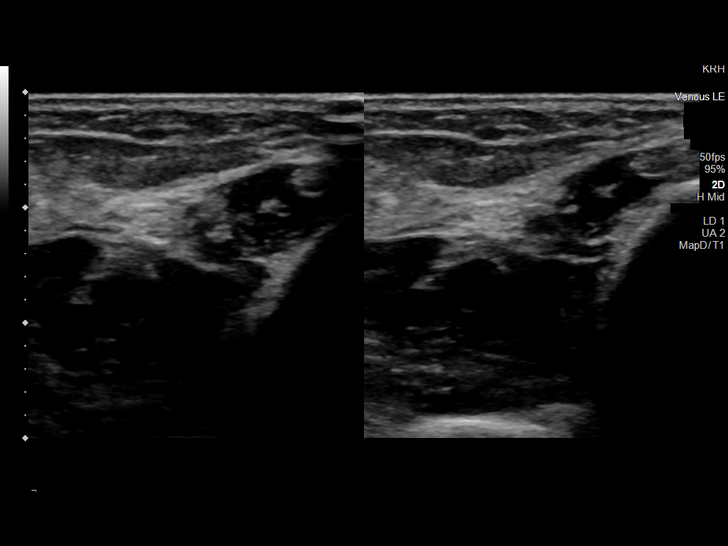
[im 35/39]
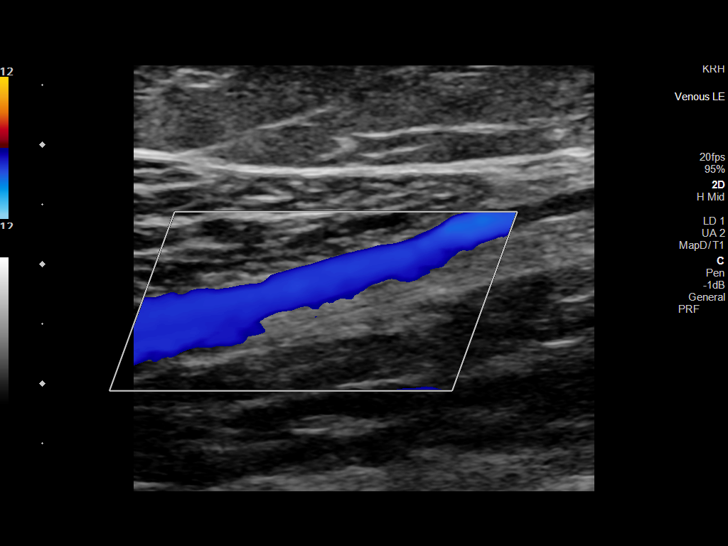
[im 39/39]
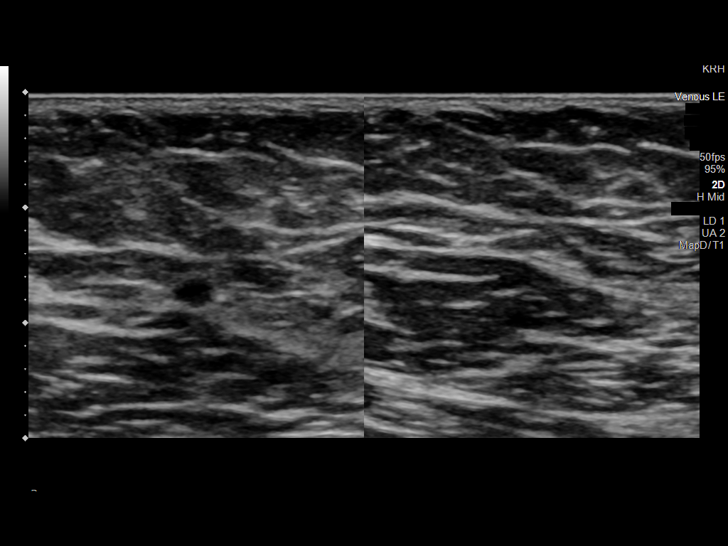

[14 of 24 positions shown; findings below may reference images not displayed]

FINDINGS: VENOUS

Normal compressibility of the LEFT common femoral, superficial
femoral, and popliteal veins, as well as the visualized calf veins.
Visualized portions of profunda femoral vein and great saphenous
vein unremarkable. No filling defects to suggest DVT on grayscale or
color Doppler imaging. Doppler waveforms show normal direction of
venous flow, normal respiratory plasticity and response to
augmentation.

Limited views of the contralateral common femoral vein are
unremarkable.

OTHER

No evidence of superficial thrombophlebitis or abnormal fluid
collection.

Limitations: none
IMPRESSION: No evidence of femoropopliteal DVT within the LEFT lower extremity.

## 2024-01-13 DIAGNOSIS — S92421D Displaced fracture of distal phalanx of right great toe, subsequent encounter for fracture with routine healing: Secondary | ICD-10-CM | POA: Diagnosis not present

## 2024-01-18 ENCOUNTER — Encounter (HOSPITAL_BASED_OUTPATIENT_CLINIC_OR_DEPARTMENT_OTHER): Payer: Self-pay | Admitting: Family
